# Patient Record
Sex: Female | Born: 1980 | Race: White | Hispanic: No | Marital: Married | Smoking: Never smoker
Health system: Southern US, Community
[De-identification: ages and names within clinical notes are randomized; demographics above are authoritative.]

## PROBLEM LIST (undated history)

## (undated) DIAGNOSIS — U071 COVID-19: Secondary | ICD-10-CM

## (undated) DIAGNOSIS — G43909 Migraine, unspecified, not intractable, without status migrainosus: Secondary | ICD-10-CM

## (undated) DIAGNOSIS — F419 Anxiety disorder, unspecified: Secondary | ICD-10-CM

## (undated) HISTORY — PX: BREAST SURGERY: SHX581

## (undated) HISTORY — DX: Anxiety disorder, unspecified: F41.9

## (undated) HISTORY — PX: BREAST EXCISIONAL BIOPSY: SUR124

---

## 2013-04-29 ENCOUNTER — Ambulatory Visit: Payer: Self-pay | Admitting: Obstetrics and Gynecology

## 2013-04-29 LAB — HEMOGLOBIN: HGB: 14.6 g/dL (ref 12.0–16.0)

## 2013-04-30 LAB — PATHOLOGY REPORT

## 2013-06-25 HISTORY — PX: DILATION AND CURETTAGE OF UTERUS: SHX78

## 2013-11-26 ENCOUNTER — Ambulatory Visit: Payer: Self-pay | Admitting: Emergency Medicine

## 2013-11-26 LAB — RAPID STREP-A WITH REFLX: Micro Text Report: NEGATIVE

## 2013-11-29 LAB — BETA STREP CULTURE(ARMC)

## 2014-05-30 ENCOUNTER — Ambulatory Visit: Payer: Self-pay | Admitting: Emergency Medicine

## 2014-06-04 ENCOUNTER — Inpatient Hospital Stay: Payer: Self-pay | Admitting: Obstetrics and Gynecology

## 2014-06-04 LAB — COMPREHENSIVE METABOLIC PANEL
ALK PHOS: 77 U/L
AST: 18 U/L (ref 15–37)
Albumin: 2.9 g/dL — ABNORMAL LOW (ref 3.4–5.0)
Anion Gap: 11 (ref 7–16)
BILIRUBIN TOTAL: 0.5 mg/dL (ref 0.2–1.0)
BUN: 7 mg/dL (ref 7–18)
CALCIUM: 8.2 mg/dL — AB (ref 8.5–10.1)
CHLORIDE: 109 mmol/L — AB (ref 98–107)
CO2: 21 mmol/L (ref 21–32)
CREATININE: 0.47 mg/dL — AB (ref 0.60–1.30)
EGFR (African American): >60
EGFR (Non-African Amer.): >60
GLUCOSE: 95 mg/dL (ref 65–99)
OSMOLALITY: 279 (ref 275–301)
Potassium: 3.6 mmol/L (ref 3.5–5.1)
SGPT (ALT): 17 U/L
Sodium: 141 mmol/L (ref 136–145)
Total Protein: 6.8 g/dL (ref 6.4–8.2)

## 2014-06-04 LAB — CBC WITH DIFFERENTIAL/PLATELET
Basophil #: 0 10*3/uL (ref 0.0–0.1)
Basophil %: 0.1 %
Eosinophil #: 0 10*3/uL (ref 0.0–0.7)
Eosinophil %: 0.1 %
HCT: 37.1 % (ref 35.0–47.0)
HGB: 12.6 g/dL (ref 12.0–16.0)
LYMPHS PCT: 3.1 %
Lymphocyte #: 0.6 10*3/uL — ABNORMAL LOW (ref 1.0–3.6)
MCH: 33.1 pg (ref 26.0–34.0)
MCHC: 33.8 g/dL (ref 32.0–36.0)
MCV: 98 fL (ref 80–100)
Monocyte #: 0.5 x10 3/mm (ref 0.2–0.9)
Monocyte %: 2.9 %
Neutrophil #: 16.7 10*3/uL — ABNORMAL HIGH (ref 1.4–6.5)
Neutrophil %: 93.8 %
Platelet: 190 10*3/uL (ref 150–440)
RBC: 3.8 10*6/uL (ref 3.80–5.20)
RDW: 14 % (ref 11.5–14.5)
WBC: 17.8 10*3/uL — ABNORMAL HIGH (ref 3.6–11.0)

## 2014-06-04 LAB — URINALYSIS, COMPLETE
BILIRUBIN, UR: NEGATIVE
BLOOD: NEGATIVE
GLUCOSE, UR: NEGATIVE mg/dL (ref 0–75)
NITRITE: NEGATIVE
PROTEIN: NEGATIVE
Ph: 6 (ref 4.5–8.0)
RBC,UR: 1 /HPF (ref 0–5)
SPECIFIC GRAVITY: 1.012 (ref 1.003–1.030)
Squamous Epithelial: 2

## 2014-06-04 LAB — FETAL FIBRONECTIN
Appearance: NORMAL
Fetal Fibronectin: NEGATIVE

## 2014-06-05 LAB — URINE CULTURE

## 2014-06-06 LAB — BETA STREP CULTURE(ARMC)

## 2014-06-09 LAB — CULTURE, BLOOD (SINGLE)

## 2014-07-12 ENCOUNTER — Emergency Department: Payer: Self-pay | Admitting: Emergency Medicine

## 2014-07-12 LAB — COMPREHENSIVE METABOLIC PANEL
ALK PHOS: 97 U/L
ALT: 24 U/L
Albumin: 2.6 g/dL — ABNORMAL LOW (ref 3.4–5.0)
Anion Gap: 8 (ref 7–16)
BILIRUBIN TOTAL: 0.3 mg/dL (ref 0.2–1.0)
BUN: 9 mg/dL (ref 7–18)
CALCIUM: 8.5 mg/dL (ref 8.5–10.1)
CHLORIDE: 106 mmol/L (ref 98–107)
Co2: 23 mmol/L (ref 21–32)
Creatinine: 0.5 mg/dL — ABNORMAL LOW (ref 0.60–1.30)
EGFR (African American): >60
GLUCOSE: 75 mg/dL (ref 65–99)
Osmolality: 271 (ref 275–301)
POTASSIUM: 4.2 mmol/L (ref 3.5–5.1)
SGOT(AST): 31 U/L (ref 15–37)
Sodium: 137 mmol/L (ref 136–145)
Total Protein: 6.8 g/dL (ref 6.4–8.2)

## 2014-07-12 LAB — CBC
HCT: 35.5 % (ref 35.0–47.0)
HGB: 11.9 g/dL — ABNORMAL LOW (ref 12.0–16.0)
MCH: 32.5 pg (ref 26.0–34.0)
MCHC: 33.4 g/dL (ref 32.0–36.0)
MCV: 98 fL (ref 80–100)
Platelet: 220 10*3/uL (ref 150–440)
RBC: 3.65 10*6/uL — ABNORMAL LOW (ref 3.80–5.20)
RDW: 14 % (ref 11.5–14.5)
WBC: 12.3 10*3/uL — AB (ref 3.6–11.0)

## 2014-07-12 LAB — TROPONIN I: Troponin-I: <0.02 ng/mL

## 2014-08-03 ENCOUNTER — Inpatient Hospital Stay: Payer: Self-pay | Admitting: Obstetrics and Gynecology

## 2014-10-15 NOTE — Op Note (Signed)
PATIENT NAME:  Ashlee Blake, [name] MR#:  [id number] DATE OF BIRTH:  1980/07/20  DATE OF PROCEDURE:  04/29/2013  PREOPERATIVE DIAGNOSIS: Missed abortion.   POSTOPERATIVE DIAGNOSIS: Missed abortion.   PROCEDURE: Suction dilation and curettage.   SURGEON: Jennell Cornerhomas Schermerhorn, M.D.   ANESTHESIA: General endotracheal anesthesia.   INDICATION: This is a 34 year old gravida 2, para 1 patient at 2613 weeks estimated gestational age with a documented missed abortion based on ultrasound on 04/28/2013 seven weeks gestation.   PROCEDURE: After adequate general endotracheal anesthesia, the patient was placed in the dorsal supine position with the legs in the candycane stirrups. Lower abdomen, perineal and vagina were prepped with Betadine. The patient's bladder was catheterized yielding 50 mL dark urine. Weighted speculum was placed in the posterior vaginal vault and the anterior cervix was grasped with a single-tooth tenaculum. Cervix was dilated to a #20 Hanks dilator without difficulty. A #8 flexible curette placed into the endometrial cavity. A large amount of tissue consistent with products of conception removed. Sharp curettage performed with good uterine cry and a repeat suction curettage with a small amount of additional tissue. Good hemostasis was noted. Silver nitrate was applied on the anterior tenacula sites. There were no complications. Estimated blood loss 25 mL. Intraoperative fluids 400 mL. The patient tolerated the procedure well and was taken to the recovery room in good condition.     ____________________________ Suzy Bouchardhomas J. Schermerhorn, MD tjs:dp D: 04/29/2013 10:39:24 ET T: 04/29/2013 11:03:41 ET JOB#: [id number]  cc: Suzy Bouchardhomas J. Schermerhorn, MD, <Dictator> Suzy BouchardHOMAS J SCHERMERHORN MD ELECTRONICALLY SIGNED 05/05/2013 10:03

## 2014-11-02 NOTE — H&P (Signed)
L&D Evaluation:  History:  HPI 33yo G3P1011 at 32+2wks by LMP and 9wk ultrasound presenting w/ c/o of diarrhea, nausea/vomiting, chills, back pain and painful contractions. Sx started 12 hrs ago acutely. No fever at home. +flu vaccine 10/14. 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   Dx this week with left axilla abscess vs hidridinidis supp vs folliculitis and is currently on topical clindamycin x 2 days.  VS: HR 87 (max 101) BP 110/61 T 99.1 O2 sats 96-98% on room air  Fetal monitoring is reassuring, Cat I strip 140s with mod var, +15x15 accels, no decels   Presents with abdominal pain, back pain, contractions, nausea/vomiting   Patient's Medical History No Chronic Illness   Patient's Surgical History none  NSVD x1   Medications Pre Natal Vitamins  Clindamycin - topical   Allergies NKDA   Social History none   Family History Non-Contributory   ROS:  ROS See HPI   Exam:  Vital Signs stable   General Ill appearing   Mental Status clear   Chest clear  Lungs CTAB x6 lung fields   Heart normal sinus rhythm, no murmur/gallop/rubs   Abdomen gravid, non-tender, No fundal or cervical motion tenderness   Estimated Fetal Weight Average for gestational age   Fetal Position cephalic by leopolds   Back ? R CVA tenderness, no L CVA   Edema no edema   Reflexes 1+   Pelvic cervix closed and thick   Mebranes Intact   FHT normal rate with no decels, period of tachy to 170 on intial assessment, now wnl   Fetal Heart Rate 140   Ucx regular, q5 min   Ucx Frequency 5 min   Length of each Contraction 2 seconds   Ucx Pain Scale 9   Skin dry, no rashes, 1cm red nodule in L axilla, not fluctuant, no surrounding erythema, no drainage   Lymph no lymphadenopathy   Impression:  Impression dehydration, Illness with preterm contractions   Plan:  Plan UA   Comments - Preterm contractions: Fetal fibronection  pending. Continuous fetal monitoring and external toco. BMZ x2. If contractions do not resolve with sx treatment, will consider serial cervical exams and mag tocolysis/neuroprotection. Currently cervix is closed and posterior. - Diarrhea: Supportive care. 500ml bolus LR, 15450ml/hr. UA  pending. CBC, CMP, blood and urine cultures pending. Physical exam non-specific, no abx at this time as I do not have a source. Left axilla looks not-infected and not spreading. DDx includes gastroenteritis, UTI and complicated UTI, viral syndrome, liver disorders, early sepsis. No peritoneal signs. VSS currently - afebrile, normotensive, O2sats normal. Will continue hydration, gastric acid reduction, pain control while awaiting labs. Consider renal u/s if urinary sx.   Electronic Signatures: Cline CoolsBeasley, Nate Common E (MD)  (Signed 11-Dec-15 00:29)  Authored: L&D Evaluation   Last Updated: 11-Dec-15 00:29 by Cline CoolsBeasley, Abenezer Odonell E (MD)

## 2014-11-02 NOTE — H&P (Signed)
L&D Evaluation:  History:  HPI 33yo G3P1011 at 41+0wks for iol/postdates. Good FM, no LOF, no VB, no ctx. 2-3cm, anterior.  GBS neg, RH+, RPR NR   Patient's Medical History No Chronic Illness   Patient's Surgical History none   Medications Pre Natal Vitamins   Allergies NKDA   Social History none   Family History Non-Contributory   ROS:  ROS All systems were reviewed.  HEENT, CNS, GI, GU, Respiratory, CV, Renal and Musculoskeletal systems were found to be normal.   Exam:  Vital Signs stable   General no apparent distress   Mental Status clear   Chest clear   Heart normal sinus rhythm   Abdomen gravid, non-tender   Estimated Fetal Weight Average for gestational age   Fetal Position cephalic   FHT normal rate with no decels   Fetal Heart Rate 140   Ucx irregular   Impression:  Impression postdates   Plan:  Plan induction of labor   Comments Pitocin induction   Electronic Signatures: Cline CoolsBeasley, Jamey Demchak E (MD)  (Signed 09-Feb-16 08:07)  Authored: L&D Evaluation   Last Updated: 09-Feb-16 08:07 by Cline CoolsBeasley, Banyan Goodchild E (MD)

## 2019-03-31 ENCOUNTER — Emergency Department
Admission: EM | Admit: 2019-03-31 | Discharge: 2019-03-31 | Disposition: A | Discharge status: Home / Self Care | Payer: BC Managed Care – PPO | Attending: Student in an Organized Health Care Education/Training Program | Admitting: Student in an Organized Health Care Education/Training Program

## 2019-03-31 ENCOUNTER — Other Ambulatory Visit: Payer: Self-pay

## 2019-03-31 ENCOUNTER — Emergency Department: Payer: BC Managed Care – PPO

## 2019-03-31 ENCOUNTER — Encounter: Payer: Self-pay | Admitting: Emergency Medicine

## 2019-03-31 DIAGNOSIS — R519 Headache, unspecified: Secondary | ICD-10-CM | POA: Diagnosis present

## 2019-03-31 DIAGNOSIS — R531 Weakness: Secondary | ICD-10-CM | POA: Diagnosis not present

## 2019-03-31 HISTORY — DX: Migraine, unspecified, not intractable, without status migrainosus: G43.909

## 2019-03-31 LAB — BASIC METABOLIC PANEL
Anion gap: 11 (ref 5–15)
BUN: 11 mg/dL (ref 6–20)
CO2: 25 mmol/L (ref 22–32)
Calcium: 9.4 mg/dL (ref 8.9–10.3)
Chloride: 104 mmol/L (ref 98–111)
Creatinine, Ser: 0.66 mg/dL (ref 0.44–1.00)
GFR calc Af Amer: >60 mL/min (ref >60)
GFR calc non Af Amer: >60 mL/min (ref >60)
Glucose, Bld: 118 mg/dL — ABNORMAL HIGH (ref 70–99)
Potassium: 3.8 mmol/L (ref 3.5–5.1)
Sodium: 140 mmol/L (ref 135–145)

## 2019-03-31 LAB — CBC
HCT: 40.4 % (ref 36.0–46.0)
Hemoglobin: 13.9 g/dL (ref 12.0–15.0)
MCH: 31.6 pg (ref 26.0–34.0)
MCHC: 34.4 g/dL (ref 30.0–36.0)
MCV: 91.8 fL (ref 80.0–100.0)
Platelets: 212 10*3/uL (ref 150–400)
RBC: 4.4 MIL/uL (ref 3.87–5.11)
RDW: 12.9 % (ref 11.5–15.5)
WBC: 4.2 10*3/uL (ref 4.0–10.5)
nRBC: 0 % (ref 0.0–0.2)

## 2019-03-31 LAB — POCT PREGNANCY, URINE: Preg Test, Ur: NEGATIVE

## 2019-03-31 MED ORDER — PROCHLORPERAZINE EDISYLATE 10 MG/2ML IJ SOLN
10.0000 mg | Freq: Once | INTRAMUSCULAR | Status: AC
  Administered 2019-03-31: 16:00:00 10 mg via INTRAVENOUS
  Filled 2019-03-31: qty 2

## 2019-03-31 MED ORDER — ACETAMINOPHEN 500 MG PO TABS
1000.0000 mg | ORAL_TABLET | Freq: Once | ORAL | Status: AC
  Administered 2019-03-31: 1000 mg via ORAL
  Filled 2019-03-31: qty 2

## 2019-03-31 MED ORDER — IOHEXOL 350 MG/ML SOLN
75.0000 mL | Freq: Once | INTRAVENOUS | Status: AC | PRN
  Administered 2019-03-31: 16:00:00 75 mL via INTRAVENOUS
  Filled 2019-03-31: qty 75

## 2019-03-31 MED ORDER — DIPHENHYDRAMINE HCL 50 MG/ML IJ SOLN
12.5000 mg | Freq: Once | INTRAMUSCULAR | Status: AC
  Administered 2019-03-31: 12.5 mg via INTRAVENOUS
  Filled 2019-03-31: qty 1

## 2019-03-31 NOTE — ED Triage Notes (Signed)
Patient presents to ED via POV from home with c/o right posterior headache. History of migraines but this does not feel like her normal migraine. Report nausea. Denies diarrhea.

## 2019-03-31 NOTE — ED Notes (Signed)
Patient transported to CT 

## 2019-03-31 NOTE — Discharge Instructions (Addendum)

## 2019-03-31 NOTE — ED Notes (Signed)
Patient c/o sharp pains in head with nausea. HX migraines but does not feel like normal migraine

## 2019-03-31 NOTE — ED Provider Notes (Signed)
[facility] Emergency Department Provider Note    First MD Initiated Contact with Patient 03/31/19 1522     (approximate)  I have reviewed the triage vital signs and the nursing notes.   HISTORY  Chief Complaint Headache    HPI Ashlee Blake is a 38 y.o. female history of migraine headaches presents the ER with sudden onset left posterior headache that felt like someone was stabbing her in the back of the head associated with generalized weakness blurred vision.  No fevers.  States that she took Motrin with some improvement in her symptoms but feels that this is the worst type of headache she is ever had.  Did described as sudden onset.  Currently rates headache is mild.  Denies any numbness or tingling.    Past Medical History:  Diagnosis Date  . Migraine    No family history on file.  There are no active problems to display for this patient.     Prior to Admission medications   Not on File    Allergies Patient has no known allergies.    Social History Social History   Tobacco Use  . Smoking status: Never Smoker  . Smokeless tobacco: Never Used  Substance Use Topics  . Alcohol use: Yes    Comment: Social   . Drug use: Never    Review of Systems Patient denies headaches, rhinorrhea, blurry vision, numbness, shortness of breath, chest pain, edema, cough, abdominal pain, nausea, vomiting, diarrhea, dysuria, fevers, rashes or hallucinations unless otherwise stated above in HPI. ____________________________________________   PHYSICAL EXAM:  VITAL SIGNS: Vitals:   03/31/19 1339  BP: 116/65  Pulse: 63  Resp: 18  Temp: 98.6 F (37 C)  SpO2: 100%    Constitutional: Alert and oriented.  Eyes: Conjunctivae are normal.  Head: Atraumatic. Nose: No congestion/rhinnorhea. Mouth/Throat: Mucous membranes are moist.   Neck: No stridor. Painless ROM.  Cardiovascular: Normal rate, regular rhythm. Grossly normal heart sounds.  Good  peripheral circulation. Respiratory: Normal respiratory effort.  No retractions. Lungs CTAB. Gastrointestinal: Soft and nontender. No distention. No abdominal bruits. No CVA tenderness. Genitourinary:  Musculoskeletal: No lower extremity tenderness nor edema.  No joint effusions. Neurologic:  CN- intact.  No facial droop, Normal FNF.  Normal heel to shin.  Sensation intact bilaterally. Normal speech and language. No gross focal neurologic deficits are appreciated. No gait instability. Skin:  Skin is warm, dry and intact. No rash noted. Psychiatric: Mood and affect are normal. Speech and behavior are normal.  ____________________________________________   LABS (all labs ordered are listed, but only abnormal results are displayed)  Results for orders placed or performed during the hospital encounter of 03/31/19 (from the past 24 hour(s))  CBC     Status: None   Collection Time: 03/31/19  1:41 PM  Result Value Ref Range   WBC 4.2 4.0 - 10.5 K/uL   RBC 4.40 3.87 - 5.11 MIL/uL   Hemoglobin 13.9 12.0 - 15.0 g/dL   HCT 49.4 49.6 - 75.9 %   MCV 91.8 80.0 - 100.0 fL   MCH 31.6 26.0 - 34.0 pg   MCHC 34.4 30.0 - 36.0 g/dL   RDW 16.3 84.6 - 65.9 %   Platelets 212 150 - 400 K/uL   nRBC 0.0 0.0 - 0.2 %  Basic metabolic panel     Status: Abnormal   Collection Time: 03/31/19  1:41 PM  Result Value Ref Range   Sodium 140 135 - 145 mmol/L  Potassium 3.8 3.5 - 5.1 mmol/L   Chloride 104 98 - 111 mmol/L   CO2 25 22 - 32 mmol/L   Glucose, Bld 118 (H) 70 - 99 mg/dL   BUN 11 6 - 20 mg/dL   Creatinine, Ser 0.66 0.44 - 1.00 mg/dL   Calcium 9.4 8.9 - 10.3 mg/dL   GFR calc non Af Amer >60 >60 mL/min   GFR calc Af Amer >60 >60 mL/min   Anion gap 11 5 - 15  Pregnancy, urine POC     Status: None   Collection Time: 03/31/19  4:15 PM  Result Value Ref Range   Preg Test, Ur NEGATIVE NEGATIVE   ____________________________________________ ____________________________________________  RADIOLOGY  I  personally reviewed all radiographic images ordered to evaluate for the above acute complaints and reviewed radiology reports and findings.  These findings were personally discussed with the patient.  Please see medical record for radiology report.  ____________________________________________   PROCEDURES  Procedure(s) performed:  Procedures    Critical Care performed: no ____________________________________________   INITIAL IMPRESSION / ASSESSMENT AND PLAN / ED COURSE  Pertinent labs & imaging results that were available during my care of the patient were reviewed by me and considered in my medical decision making (see chart for details).   DDX: migraine, tension, cluster, sah, iph, mass  Ashlee Blake is a 38 y.o. who presents to the ED with headache as described above.  Neuro exam is intact.  Given the sudden onset nature CT imaging was ordered to evaluate for aneurysm or subarachnoid.  CT imaging is unremarkable.  Patient was given migraine cocktail with improvement in symptoms.  Likely variant of her known migraine headaches.  No evidence of infectious process.  Do believe she stable and appropriate for outpatient follow-up.     The patient was evaluated in Emergency Department today for the symptoms described in the history of present illness. He/she was evaluated in the context of the global COVID-19 pandemic, which necessitated consideration that the patient might be at risk for infection with the SARS-CoV-2 virus that causes COVID-19. Institutional protocols and algorithms that pertain to the evaluation of patients at risk for COVID-19 are in a state of rapid change based on information released by regulatory bodies including the CDC and federal and state organizations. These policies and algorithms were followed during the patient's care in the ED.  As part of my medical decision making, I reviewed the following data within the [place] notes reviewed  and incorporated, Labs reviewed, notes from prior ED visits and [place] Controlled Substance Database   ____________________________________________   FINAL CLINICAL IMPRESSION(S) / ED DIAGNOSES  Final diagnoses:  Bad headache      NEW MEDICATIONS STARTED DURING THIS VISIT:  New Prescriptions   No medications on file     Note:  This document was prepared using Dragon voice recognition software and may include unintentional dictation errors.    Merlyn Lot, MD 03/31/19 1721

## 2019-04-16 NOTE — Progress Notes (Signed)
Virtual Visit via Video Note The purpose of this virtual visit is to provide medical care while limiting exposure to the novel coronavirus.    Consent was obtained for video visit:  Yes.   Answered questions that patient had about telehealth interaction:  Yes.   I discussed the limitations, risks, security and privacy concerns of performing an evaluation and management service by telemedicine. I also discussed with the patient that there may be a patient responsible charge related to this service. The patient expressed understanding and agreed to proceed.  Pt location: Home Physician Location: office Name of referring provider:  [place] Pink, MD I connected with Ashlee Blake at patients initiation/request on 04/20/2019 at  2:50 PM EDT by video enabled telemedicine application and verified that I am speaking with the correct person using two identifiers. Pt MRN:  [id number] Pt DOB:  1980/10/26 Video Participants:  Ashlee Blake   History of Present Illness:  Ashlee Blake is a 38 year old female who presents for headaches.  History supplemented by ED note.  CT/CTA of head personally reviewed.  She presented to the ED at [facility] on 03/31/2019 with sudden onset severe stabbing left occipital headache.  There was associated dizziness, blurred vision, nausea, generalized weakness.  She reported difficulty moving both sides of her face.  No vomiting, photophobia, phonophobia, language deficit or numbness and tingling.  Motrin provided mild relief.  It was the worst headache of her life.  She was afebrile.  CT/CTA of head negative for bleed or aneurysm.  She was treated with oral Tylenol and injection of Benadryl, Compazine and iohexol.  It lasted about 3 hours.  She has history of migraines since her early 9s, described as a moderate pressure bi-occipital headache with photophobia and phonophobia but no nausea, vomiting, or visual disturbance.  They respond to ibuprofen and rest in bed and resolve after  a couple of hours.  They occur once a year.  When she was diagnosed with migraines, she had a migraine aura without headache, described as dizziness and asymmetric face.  MRI of brain at that time was reportedly unremarkable.     Caffeine:  Iced tea or coffee daily Diet:  hydrates Exercise:  yes Depression:  no; Anxiety:  Yes.   Other pain:  no Sleep hygiene:  varies Family history:  Sister (migraine), mother (stroke)   Past Medical History: Past Medical History:  Diagnosis Date  . Migraine     Medications: No outpatient encounter medications on file as of 04/20/2019.   No facility-administered encounter medications on file as of 04/20/2019.     Allergies: No Known Allergies  Family History: No family history on file.  Social History: Social History   Socioeconomic History  . Marital status: Married    Spouse name: Not on file  . Number of children: Not on file  . Years of education: Not on file  . Highest education level: Not on file  Occupational History  . Not on file  Social Needs  . Financial resource strain: Not on file  . Food insecurity    Worry: Not on file    Inability: Not on file  . Transportation needs    Medical: Not on file    Non-medical: Not on file  Tobacco Use  . Smoking status: Never Smoker  . Smokeless tobacco: Never Used  Substance and Sexual Activity  . Alcohol use: Yes    Comment: Social   . Drug use: Never  . Sexual activity:  Not on file  Lifestyle  . Physical activity    Days per week: Not on file    Minutes per session: Not on file  . Stress: Not on file  Relationships  . Social Musician on phone: Not on file    Gets together: Not on file    Attends religious service: Not on file    Active member of club or organization: Not on file    Attends meetings of clubs or organizations: Not on file    Relationship status: Not on file  . Intimate partner violence    Fear of current or ex partner: Not on file     Emotionally abused: Not on file    Physically abused: Not on file    Forced sexual activity: Not on file  Other Topics Concern  . Not on file  Social History Narrative  . Not on file    Observations/Objective:   Height 5\' 5"  (1.651 m), weight 128 lb (58.1 kg), last menstrual period 03/31/2019. No acute distress.  Alert and oriented.  Speech fluent and not dysarthric.  Language intact.  Eyes orthophoric on primary gaze.  Face symmetric.  Assessment and Plan:   1.  Migraine with aura, without status migrainosus, not intractable.  Hopefully, an isolated event 2.  Migraine without aura, without status migrainosus, not intractable.  Stable  Given that this is an isolated event and her typical migraines are infrequent, I would not initiate a preventative medication.  She wishes to continue using OTC analgesics.  If she should have a recurrent migraine that does not respond to OTC analgesics, she is willing to consider triptan.  Follow Up Instructions:    -I discussed the assessment and treatment plan with the patient. The patient was provided an opportunity to ask questions and all were answered. The patient agreed with the plan and demonstrated an understanding of the instructions.   The patient was advised to call back or seek an in-person evaluation if the symptoms worsen or if the condition fails to improve as anticipated.  05/31/2019, DO

## 2019-04-20 ENCOUNTER — Other Ambulatory Visit: Payer: Self-pay

## 2019-04-20 ENCOUNTER — Telehealth (INDEPENDENT_AMBULATORY_CARE_PROVIDER_SITE_OTHER): Payer: BC Managed Care – PPO | Admitting: Neurology

## 2019-04-20 ENCOUNTER — Encounter: Payer: Self-pay | Admitting: Neurology

## 2019-04-20 VITALS — Ht 65.0 in | Wt 128.0 lb

## 2019-04-20 DIAGNOSIS — G43009 Migraine without aura, not intractable, without status migrainosus: Secondary | ICD-10-CM

## 2019-04-20 DIAGNOSIS — G43109 Migraine with aura, not intractable, without status migrainosus: Secondary | ICD-10-CM

## 2020-08-17 ENCOUNTER — Other Ambulatory Visit: Payer: Self-pay | Admitting: Obstetrics and Gynecology

## 2020-08-17 DIAGNOSIS — Z1231 Encounter for screening mammogram for malignant neoplasm of breast: Secondary | ICD-10-CM

## 2020-08-17 LAB — RESULTS CONSOLE HPV: CHL HPV: NEGATIVE

## 2020-08-17 LAB — HM PAP SMEAR: HM Pap smear: NEGATIVE

## 2020-08-18 ENCOUNTER — Other Ambulatory Visit: Payer: Self-pay | Admitting: Obstetrics and Gynecology

## 2020-08-18 DIAGNOSIS — N6310 Unspecified lump in the right breast, unspecified quadrant: Secondary | ICD-10-CM

## 2020-08-30 ENCOUNTER — Other Ambulatory Visit: Payer: Self-pay

## 2020-08-30 ENCOUNTER — Inpatient Hospital Stay: Admission: RE | Admit: 2020-08-30 | Payer: BC Managed Care – PPO | Source: Ambulatory Visit

## 2020-08-30 ENCOUNTER — Ambulatory Visit
Admission: RE | Admit: 2020-08-30 | Discharge: 2020-08-30 | Disposition: A | Discharge status: Home / Self Care | Payer: BC Managed Care – PPO | Source: Ambulatory Visit | Attending: Obstetrics and Gynecology | Admitting: Obstetrics and Gynecology

## 2020-08-30 DIAGNOSIS — N6310 Unspecified lump in the right breast, unspecified quadrant: Secondary | ICD-10-CM

## 2020-09-04 ENCOUNTER — Ambulatory Visit
Admission: EM | Admit: 2020-09-04 | Discharge: 2020-09-04 | Disposition: A | Discharge status: Home / Self Care | Payer: BC Managed Care – PPO | Attending: Emergency Medicine | Admitting: Emergency Medicine

## 2020-09-04 ENCOUNTER — Other Ambulatory Visit: Payer: Self-pay

## 2020-09-04 DIAGNOSIS — M79605 Pain in left leg: Secondary | ICD-10-CM

## 2020-09-04 NOTE — ED Provider Notes (Signed)
[facility] [place] [facility] [place]    Name: Ashlee Blake DOB: Apr 01, 1981 MRN: [id number] CSN: [id number] PCP: Jerl Mina, MD  Arrival date and time:  09/04/20 1115  Chief Complaint:  Leg Pain (left)   NOTE: Prior to seeing the patient today, I have reviewed the triage nursing documentation and vital signs. Clinical staff has updated patient's PMH/PSHx, current medication list, and drug allergies/intolerances to ensure comprehensive history available to assist in medical decision making.   History:   HPI: Ashlee Blake is a 40 y.o. female who presents today with complaints of left leg pain.  Patient states his left leg pain started approximately 1/2 weeks ago after her dermatological surgery to remove atypical cells in the back of her left knee.  The pain has been continuous and has not been able to be resolved with ice.  The pain starts at the location of her incision and radiates down the left side of her calf muscles to her anterior ankle.  She is also noted some swelling to the outside of her left ankle which made it difficult for her to put her tennis shoes on.  She has been following the instructions were given by her dermatologist and has been abstaining from strenuous activity.  She denies any fevers, body aches, fevers, or general malaise.  Past Medical History:  Diagnosis Date  . Migraine     Past Surgical History:  Procedure Laterality Date  . BREAST EXCISIONAL BIOPSY    . DILATION AND CURETTAGE OF UTERUS  2015    Family History  Problem Relation Age of Onset  . Stroke Mother   . Hypertension Mother   . High Cholesterol Mother   . Diabetes Mother        boarder line  . Healthy Father   . Healthy Sister   . Healthy Son   . Breast cancer Other     Social History   Tobacco Use  . Smoking status: Never Smoker  . Smokeless tobacco: Never Used  Vaping Use  . Vaping Use: Never used  Substance Use Topics  . Alcohol use: Yes    Comment: Social   . Drug use:  Never    There are no problems to display for this patient.   Home Medications:    Current Meds  Medication Sig  . Ascorbic Acid (VITAMIN C PO) Take 1,400 mg by mouth. Take 2 a day  . cephALEXin (KEFLEX) 500 MG capsule Take 500 mg by mouth 2 (two) times daily.  . Cholecalciferol (VITAMIN D3 PO) Take 1,000 Units by mouth.  . fluticasone (FLONASE) 50 MCG/ACT nasal spray Place into the nose.  . Loratadine 10 MG CAPS Take by mouth.  . magnesium (MAGTAB) 84 MG ( ) TBCR SR tablet Take 84 mg by mouth.  . valACYclovir (VALTREX) 1000 MG tablet TAKE 1 TABLET (1,000 MG TOTAL) BY MOUTH 2 (TWO) TIMES DAILY AS NEEDED    Allergies:   Patient has no known allergies.  Review of Systems (ROS): Review of Systems  Constitutional: Negative for fatigue and fever.  Musculoskeletal: Positive for joint swelling and myalgias. Negative for gait problem.  All other systems reviewed and are negative.    Vital Signs: Today's Vitals   09/04/20 1126 09/04/20 1128  BP:  124/82  Pulse:  73  Resp:  18  Temp:  98.6 F (37 C)  TempSrc:  Oral  SpO2:  100%  Weight: 132 lb (59.9 kg)   Height: 5\' 5"  (1.651 m)  PainSc: 4      Physical Exam: Physical Exam Vitals and nursing note reviewed.  Constitutional:      Appearance: Normal appearance.  HENT:     Right Ear: Tympanic membrane normal.     Left Ear: Tympanic membrane normal.  Cardiovascular:     Rate and Rhythm: Normal rate and regular rhythm.     Pulses: Normal pulses.          Dorsalis pedis pulses are 2+ on the right side and 2+ on the left side.       Posterior tibial pulses are 2+ on the right side and 2+ on the left side.     Heart sounds: Normal heart sounds.  Pulmonary:     Effort: Pulmonary effort is normal.     Breath sounds: Normal breath sounds.  Musculoskeletal:     Left foot: Normal range of motion. Swelling present.     Comments: Mild nonpitting edema noted to the external aspect of left knee.  Feet:     Left foot:      Skin integrity: No erythema or warmth.  Skin:    General: Skin is warm and dry.       Neurological:     General: No focal deficit present.     Mental Status: She is alert and oriented to person, place, and time.  Psychiatric:        Mood and Affect: Mood normal.        Behavior: Behavior normal.     Urgent Care Treatments / Results:   LABS: PLEASE NOTE: all labs that were ordered this encounter are listed, however only abnormal results are displayed. Labs Reviewed - No data to display  EKG: -None  RADIOLOGY: No results found.  PROCEDURES: Procedures  MEDICATIONS RECEIVED THIS VISIT: Medications - No data to display  PERTINENT CLINICAL COURSE NOTES/UPDATES:   Initial Impression / Assessment and Plan / Urgent Care Course:  Pertinent labs & imaging results that were available during my care of the patient were personally reviewed by me and considered in my medical decision making (see lab/imaging section of note for values and interpretations).  Ashlee Blake is a 40 y.o. female who presents to [facility] today with complaints of left leg pain, diagnosed with the same, and treated as such with the directions below. NP and patient reviewed discharge instructions below during visit.   Patient is well appearing overall in clinic today. She does not appear to be in any acute distress. Presenting symptoms (see HPI) and exam as documented above.   I have reviewed the follow up and strict return precautions for any new or worsening symptoms. Patient is aware of symptoms that would be deemed urgent/emergent, and would thus require further evaluation either here or in the emergency department. At the time of discharge, she verbalized understanding and consent with the discharge plan as it was reviewed with her. All questions were fielded by provider and/or clinic staff prior to patient discharge.    Final Clinical Impressions / Urgent Care Diagnoses:   Final diagnoses:  Left  leg pain    New Prescriptions:  [place] Controlled Substance Registry consulted? Not Applicable  No orders of the defined types were placed in this encounter.     Discharge Instructions     You were seen for left leg pain and are being treated for the same..   Make sure you follow-up with your dermatologist as scheduled on Tuesday.  Use over-the-counter NSAIDs such  as naproxen and ibuprofen as needed for the pain.  Elevate your left leg when sitting in bed.  Compression as needed.  Take care, Dr. Sharlet Salina, NP-c     Recommended Follow up Care:  Patient encouraged to follow up with the following provider within the specified time frame, or sooner as dictated by the severity of her symptoms. As always, she was instructed that for any urgent/emergent care needs, she should seek care either here or in the emergency department for more immediate evaluation.   Bailey Mech, DNP, NP-c    Bailey Mech, NP 09/04/20 1153

## 2020-09-04 NOTE — Discharge Instructions (Signed)
You were seen for left leg pain and are being treated for the same..   Make sure you follow-up with your dermatologist as scheduled on Tuesday.  Use over-the-counter NSAIDs such as naproxen and ibuprofen as needed for the pain.  Elevate your left leg when sitting in bed.  Compression as needed.  Take care, Dr. Sharlet Salina, NP-c

## 2020-09-04 NOTE — ED Triage Notes (Addendum)
Pt c/o recent skin surgery to remove some atypical skin cells (08/22/20) behind her left knee. Pt states the area still has stitches, to be removed this coming Tuesday. Pt states the incision area is painful and red. She is taking cephalexin post surgery. Pt reports pain down the back of her left leg, into her ankle, with some swelling to the ankle. Pt denies redness or streaking down the leg, f/n/v/d or other symptoms.

## 2020-09-05 ENCOUNTER — Other Ambulatory Visit: Payer: Self-pay | Admitting: Orthopaedic Surgery

## 2020-09-05 ENCOUNTER — Ambulatory Visit
Admission: RE | Admit: 2020-09-05 | Discharge: 2020-09-05 | Disposition: A | Discharge status: Home / Self Care | Payer: BC Managed Care – PPO | Source: Ambulatory Visit | Attending: Orthopaedic Surgery | Admitting: Orthopaedic Surgery

## 2020-09-05 DIAGNOSIS — R2242 Localized swelling, mass and lump, left lower limb: Secondary | ICD-10-CM

## 2022-04-21 IMAGING — MG DIGITAL DIAGNOSTIC BILAT W/ TOMO W/ CAD
6 of 10 series · 6 of 30 positions shown · non-contrast
Comparison: Previous exam(s).

CLINICAL DATA: Patient presents for palpable abnormality within the
right breast.

EXAM:
DIGITAL DIAGNOSTIC BILATERAL MAMMOGRAM WITH TOMOSYNTHESIS AND CAD;
ULTRASOUND RIGHT BREAST LIMITED
TECHNIQUE: Bilateral digital diagnostic mammography and breast tomosynthesis
was performed. The images were evaluated with computer-aided
detection.; Targeted ultrasound examination of the right breast was
performed

[R TAN synth-2D]
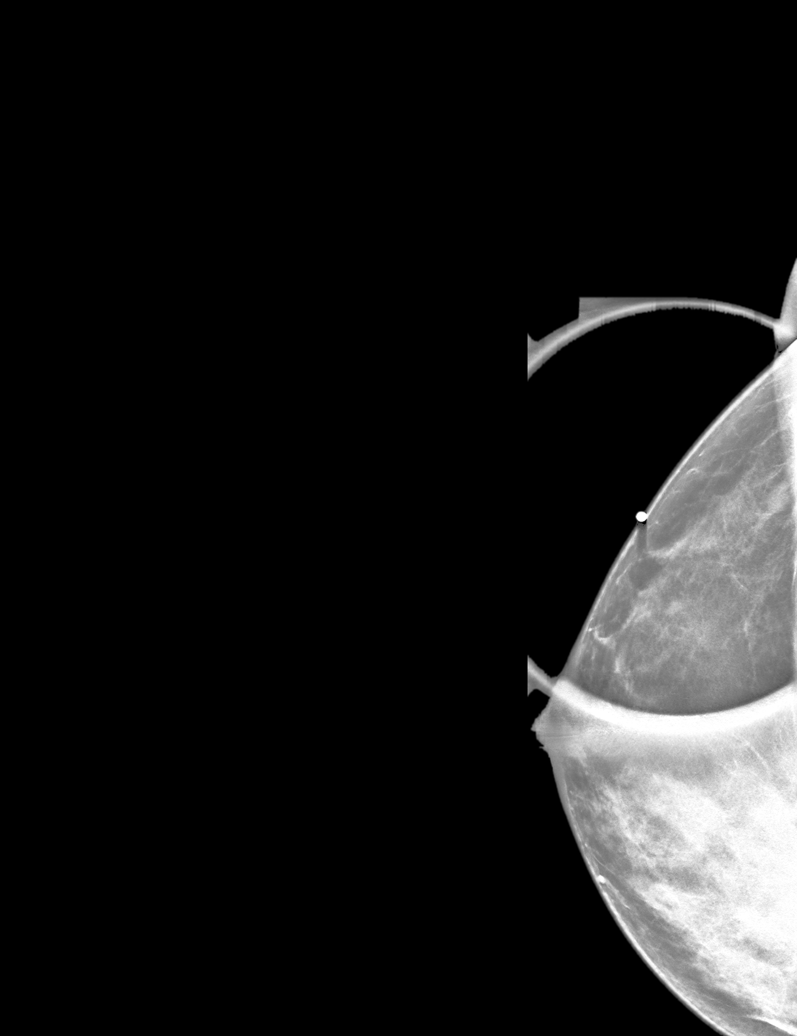

[L MLO synth-2D]
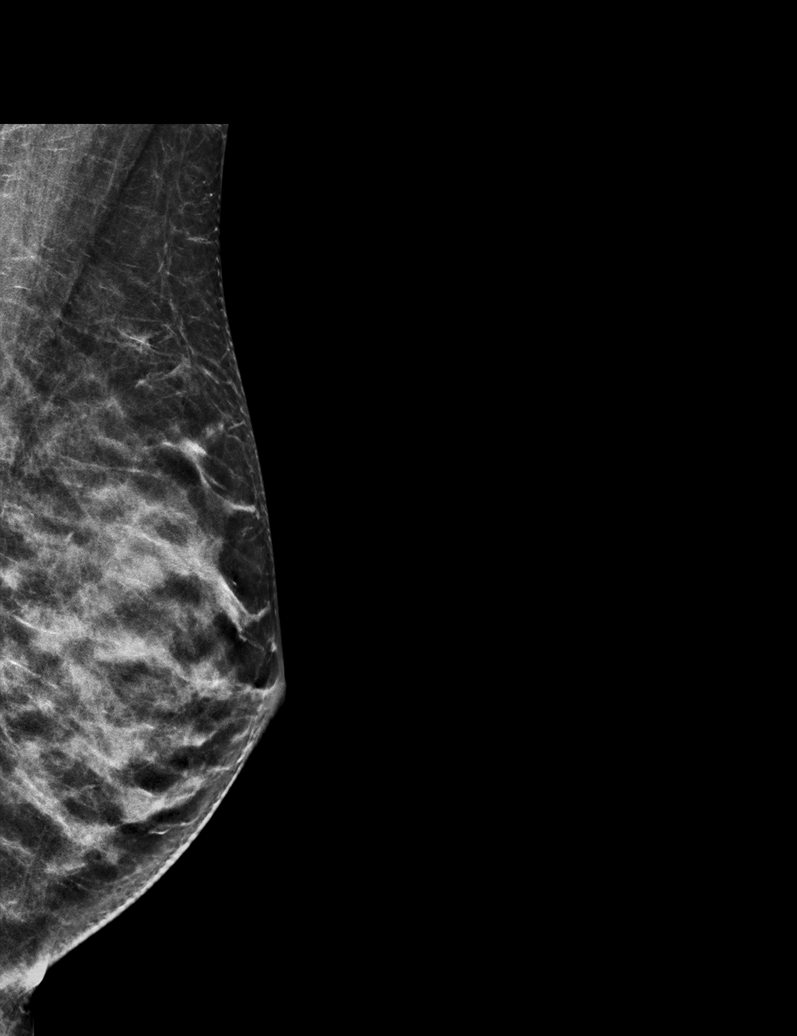

[L CC synth-2D]
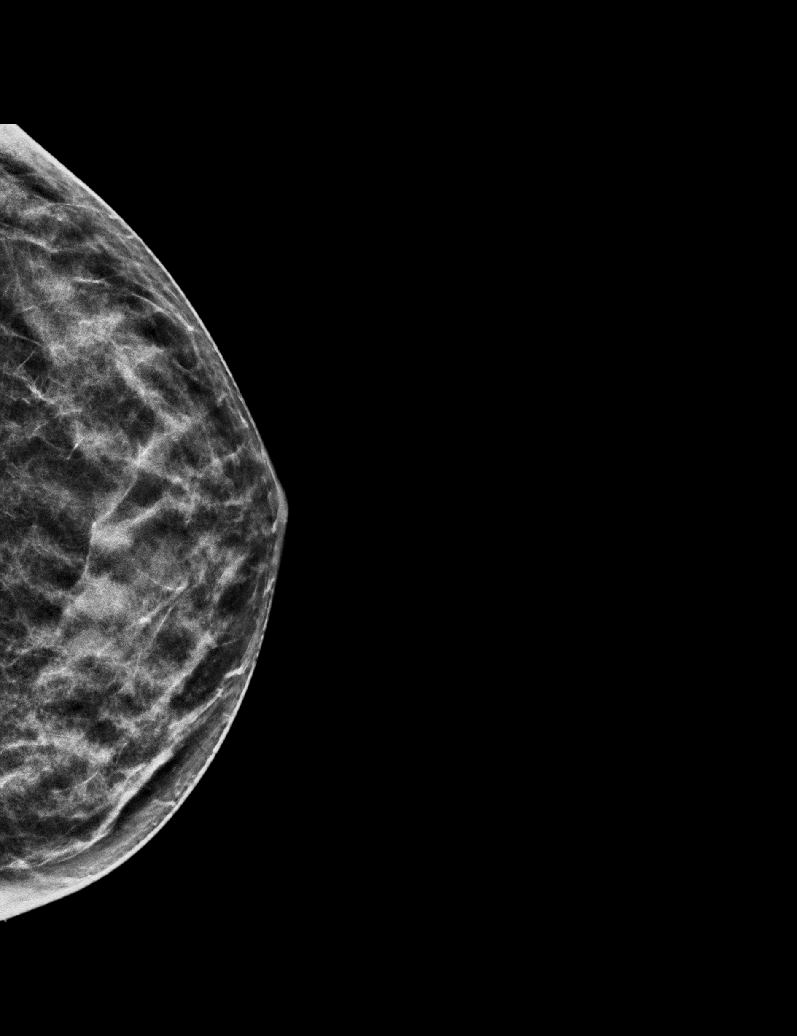

[R MLO synth-2D]
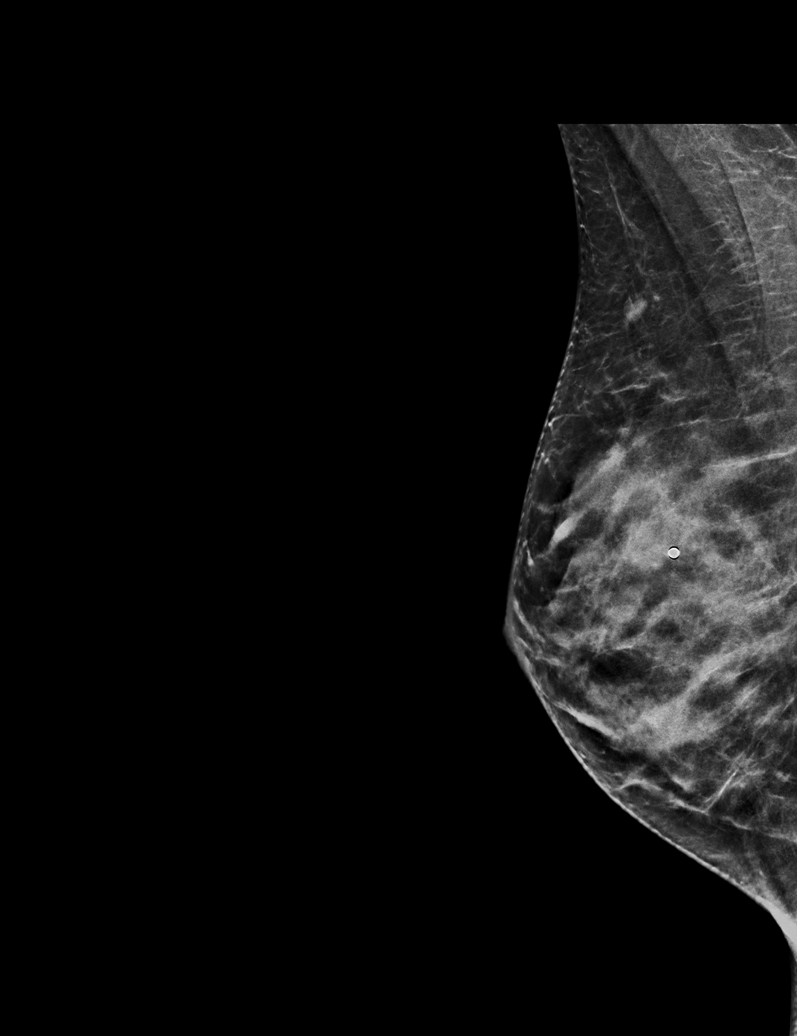

[R CC synth-2D]
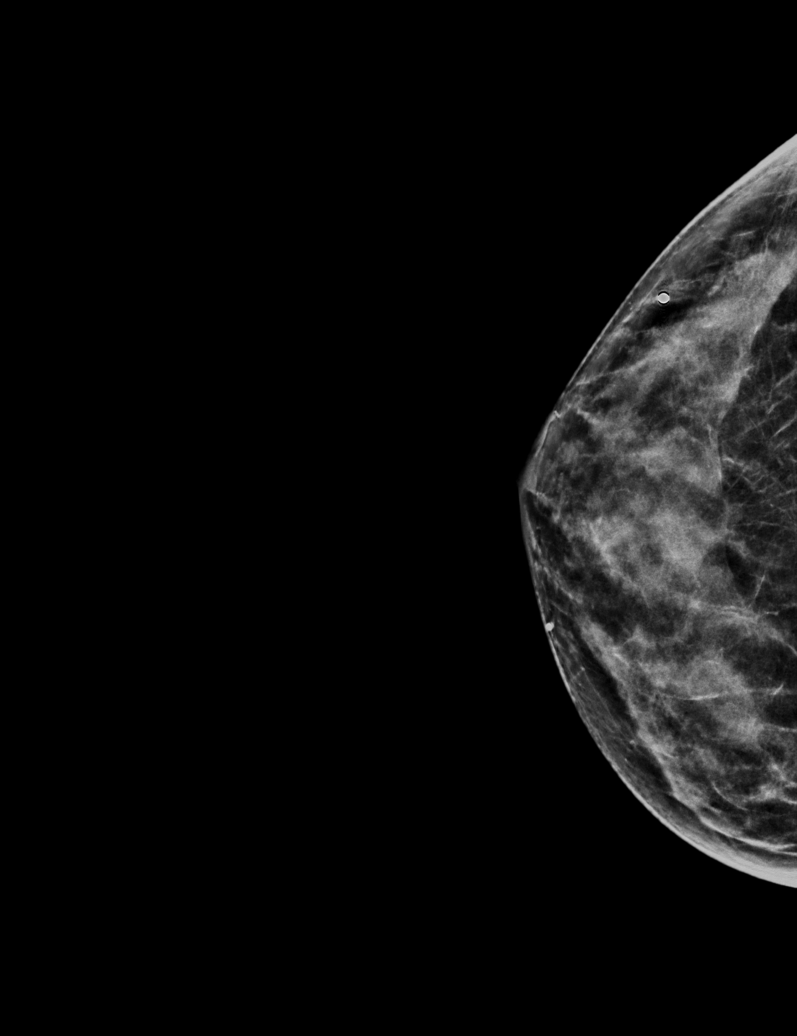

[R MLO tomo · tomo slice 28/55.0]
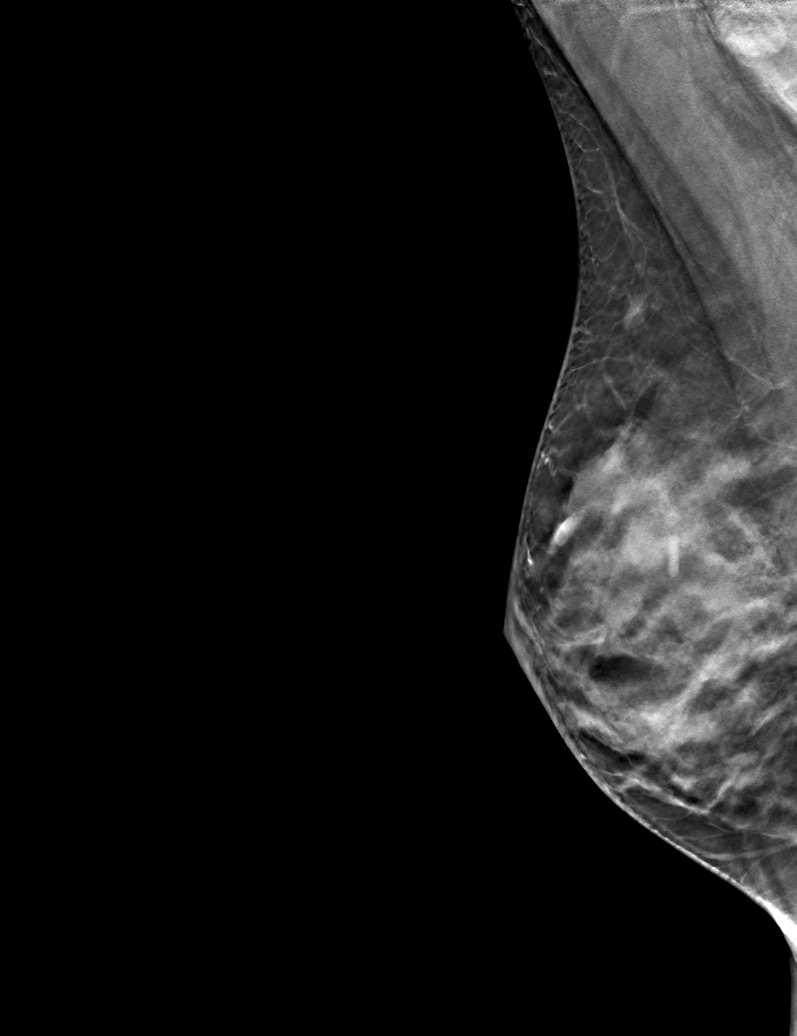

[6 of 30 positions shown; findings below may reference images not displayed]

ACR Breast Density Category c: The breast tissue is heterogeneously
dense, which may obscure small masses.
FINDINGS: No concerning masses, calcifications or nonsurgical distortion
identified within the right or left breast.

On physical exam, dense tissue is palpated within the outer right
breast.

Targeted ultrasound is performed, showing normal dense tissue
without suspicious mass within the outer right breast.
IMPRESSION: No mammographic evidence for malignancy.

RECOMMENDATION:
Continued clinical evaluation for right breast palpable area of
concern.

Screening mammogram at age 42 unless there are persistent or
intervening clinical concerns. (Code:DH-D-[id number])

I have discussed the findings and recommendations with the patient.
If applicable, a reminder letter will be sent to the patient
regarding the next appointment.

BI-RADS CATEGORY  2: Benign.

## 2022-09-27 ENCOUNTER — Ambulatory Visit
Admission: EM | Admit: 2022-09-27 | Discharge: 2022-09-27 | Disposition: A | Discharge status: Home / Self Care | Payer: BC Managed Care – PPO

## 2022-09-27 ENCOUNTER — Other Ambulatory Visit: Payer: Self-pay

## 2022-09-27 DIAGNOSIS — U071 COVID-19: Secondary | ICD-10-CM

## 2022-09-27 DIAGNOSIS — R509 Fever, unspecified: Secondary | ICD-10-CM | POA: Diagnosis not present

## 2022-09-27 DIAGNOSIS — R051 Acute cough: Secondary | ICD-10-CM | POA: Diagnosis not present

## 2022-09-27 NOTE — Discharge Instructions (Addendum)
-  Newest CDC guidelines state you should stay at home and isolate until you are 24 hours without a fever and your symptoms have improved.  This will probably be at least another 2 to 3 days for you. - Take ibuprofen and Tylenol as needed for fever and make sure to rest increase your fluid intake. - Take over-the-counter cough medication as needed for your symptoms. - Return or go to ER if you have any uncontrolled fever, weakness or shortness of breath that is worsening.

## 2022-09-27 NOTE — ED Triage Notes (Addendum)
Pt is here after testing POSITIVE for COVID(home test) with same symptoms of chills, sore throat, bodyaches, cough, nasal congestion, pt has taken OTC meds to relieve discomfort. Pt denies chest pain when coughing and pain overall.

## 2022-09-27 NOTE — ED Provider Notes (Signed)
[facility]    CSN: [id number] Arrival date & time: 09/27/22  1311      History   Chief Complaint Chief Complaint  Patient presents with   Sore Throat   Chills   Nasal Congestion    HPI Ashlee Blake is a 42 y.o. female presenting for 4 to 5-day history of fever, fatigue, body aches, cough, congestion, sore throat and shortness of breath.  Denies abdominal pain, vomiting or diarrhea.  Sore throat has improved.  Fevers have only gone down with use of antipyretics.  She has not taken any antipyretics today.  She denies any sick contacts.  States she did a COVID test 4 days ago and is positive.  She denies any history of any major medical problems.  No history of cardiopulmonary disease, diabetes, autoimmune disease.  Taking OTC meds.  No other complaints.  HPI  Past Medical History:  Diagnosis Date   Migraine     There are no problems to display for this patient.   Past Surgical History:  Procedure Laterality Date   BREAST EXCISIONAL BIOPSY     DILATION AND CURETTAGE OF UTERUS  2015    OB History   No obstetric history on file.      Home Medications    Prior to Admission medications   Medication Sig Start Date End Date Taking? Authorizing Provider  Ascorbic Acid (VITAMIN C PO) Take 1,400 mg by mouth. Take 2 a day    [provider]  cephALEXin (KEFLEX) 500 MG capsule Take 500 mg by mouth 2 (two) times daily. 08/08/20   [provider]  Cholecalciferol (VITAMIN D3 PO) Take 1,000 Units by mouth.    [provider]  fluticasone (FLONASE) 50 MCG/ACT nasal spray Place into the nose. 04/16/15   [provider]  levonorgestrel (LILETTA, 52 MG,) 20.1 MCG/DAY IUD IUD by Intrauterine route.    [provider]  Loratadine 10 MG CAPS Take by mouth.    [provider]  magnesium (MAGTAB) 84 MG (7MEQ) TBCR SR tablet Take 84 mg by mouth.    [provider]  valACYclovir (VALTREX) 1000 MG tablet TAKE 1 TABLET  (1,000 MG TOTAL) BY MOUTH 2 (TWO) TIMES DAILY AS NEEDED 09/27/16   [provider]    Family History Family History  Problem Relation Age of Onset   Stroke Mother    Hypertension Mother    High Cholesterol Mother    Diabetes Mother        boarder line   Healthy Father    Healthy Sister    Healthy Son    Breast cancer Other     Social History Social History   Tobacco Use   Smoking status: Never   Smokeless tobacco: Never  Vaping Use   Vaping Use: Never used  Substance Use Topics   Alcohol use: Yes    Comment: Social    Drug use: Never     Allergies   Patient has no known allergies.   Review of Systems Review of Systems  Constitutional:  Positive for fatigue and fever. Negative for chills and diaphoresis.  HENT:  Positive for congestion, rhinorrhea and sore throat. Negative for ear pain, sinus pressure and sinus pain.   Respiratory:  Positive for cough and shortness of breath. Negative for wheezing.   Cardiovascular:  Negative for chest pain.  Gastrointestinal:  Negative for abdominal pain, nausea and vomiting.  Musculoskeletal:  Negative for arthralgias and myalgias.  Skin:  Negative for  rash.  Neurological:  Negative for weakness and headaches.  Hematological:  Negative for adenopathy.     Physical Exam Triage Vital Signs ED Triage Vitals  Enc Vitals Group     BP --      Pulse --      Resp --      Temp --      Temp Source 09/27/22 1319 Oral     SpO2 --      Weight --      Height --      Head Circumference --      Peak Flow --      Pain Score 09/27/22 1317 0     Pain Loc --      Pain Edu? --      Excl. in [place]? --    No data found.  Updated Vital Signs BP 102/68 (BP Location: Right Arm)   Pulse (!) 109   Temp (!) 102.2 F (39 C) (Oral) Comment: notified the provider  Resp 17   SpO2 98%      Physical Exam Vitals and nursing note reviewed.  Constitutional:      General: She is not in acute distress.    Appearance: Normal  appearance. She is ill-appearing and diaphoretic. She is not toxic-appearing.  HENT:     Head: Normocephalic and atraumatic.     Nose: Congestion present.     Mouth/Throat:     Mouth: Mucous membranes are moist.     Pharynx: Oropharynx is clear.  Eyes:     General: No scleral icterus.       Right eye: No discharge.        Left eye: No discharge.     Conjunctiva/sclera: Conjunctivae normal.  Cardiovascular:     Rate and Rhythm: Regular rhythm. Tachycardia present.     Heart sounds: Normal heart sounds.  Pulmonary:     Effort: Pulmonary effort is normal. No respiratory distress.     Breath sounds: Normal breath sounds. No wheezing, rhonchi or rales.  Musculoskeletal:     Cervical back: Neck supple.  Neurological:     General: No focal deficit present.     Mental Status: She is alert. Mental status is at baseline.     Motor: No weakness.     Gait: Gait normal.  Psychiatric:        Mood and Affect: Mood normal.        Behavior: Behavior normal.        Thought Content: Thought content normal.      UC Treatments / Results  Labs (all labs ordered are listed, but only abnormal results are displayed) Labs Reviewed - No data to display  EKG   Radiology No results found.  Procedures Procedures (including critical care time)  Medications Ordered in UC Medications - No data to display  Initial Impression / Assessment and Plan / UC Course  I have reviewed the triage vital signs and the nursing notes.  Pertinent labs & imaging results that were available during my care of the patient were reviewed by me and considered in my medical decision making (see chart for details).   42 year old female presents for fever, fatigue, body aches, cough, congestion, sore throat and shortness of breath that began 4 to 5 days ago.  Positive COVID test 4 days ago.  Temp currently 102.2 degrees.  Pulse elevated at 109 bpm.  She is ill-appearing and diaphoretic.  She is in no acute distress.   On exam  she has nasal congestion.  Throat is clear.  Chest clear to auscultation heart regular rhythm.  Patient does not need repeat COVID testing so we will hold off at this time.  Symptoms and presentation are consistent with acute COVID-19 infection.  Patient has no significant medical problems to qualify her for antiviral therapy and she is already at 4 to 5 days of the illness.  Advised her at this time supportive care should be continued with increasing rest and fluids and continuing antipyretics.  Offered cough medication but she declined stating she will continue with OTC cough meds.  Reviewed current CDC guidelines, isolation protocol and ED precautions.  Work note given.   Final Clinical Impressions(s) / UC Diagnoses   Final diagnoses:  COVID-19  Fever, unspecified  Acute cough     Discharge Instructions      -Newest CDC guidelines state you should stay at home and isolate until you are 24 hours without a fever and your symptoms have improved.  This will probably be at least another 2 to 3 days for you. - Take ibuprofen and Tylenol as needed for fever and make sure to rest increase your fluid intake. - Take over-the-counter cough medication as needed for your symptoms. - Return or go to ER if you have any uncontrolled fever, weakness or shortness of breath that is worsening.     ED Prescriptions   None    PDMP not reviewed this encounter.   Danton Clap, PA-C 09/27/22 1407

## 2022-10-06 ENCOUNTER — Ambulatory Visit
Admission: EM | Admit: 2022-10-06 | Discharge: 2022-10-06 | Disposition: A | Discharge status: Home / Self Care | Payer: BC Managed Care – PPO | Attending: Family Medicine | Admitting: Family Medicine

## 2022-10-06 DIAGNOSIS — H60392 Other infective otitis externa, left ear: Secondary | ICD-10-CM | POA: Diagnosis not present

## 2022-10-06 HISTORY — DX: COVID-19: U07.1

## 2022-10-06 MED ORDER — CIPROFLOXACIN HCL 500 MG PO TABS: 500.0000 mg | ORAL_TABLET | Freq: Two times a day (BID) | ORAL | 0 refills | Status: DC

## 2022-10-06 NOTE — ED Provider Notes (Signed)
[facility]    CSN: [id number] Arrival date & time: 10/06/22  1031      History   Chief Complaint Chief Complaint  Patient presents with   ear swelling    Left ear swelling    HPI 42 year old female presents for evaluation of the above.   Piercing ~ 5 months ago.  She has seen the individual who did the piercing and had it altered.  She has now developed swelling and pain.  She states that she was advised to see a physician.  Patient is concerned about possible chemical or allergic reaction.  She states that she has been taking good care of it and doing what she is supposed to do to keep it clean and to prevent infection.  No fever.  It is red.   Home Medications    Prior to Admission medications   Medication Sig Start Date End Date Taking? Authorizing Provider  Ascorbic Acid (VITAMIN C PO) Take 1,400 mg by mouth. Take 2 a day   Yes [provider]  cephALEXin (KEFLEX) 500 MG capsule Take 500 mg by mouth 2 (two) times daily. 08/08/20  Yes [provider]  Cholecalciferol (VITAMIN D3 PO) Take 1,000 Units by mouth.   Yes [provider]  ciprofloxacin (CIPRO) 500 MG tablet Take 1 tablet (500 mg total) by mouth 2 (two) times daily. 10/06/22  Yes Idaliz Tinkle G, DO  fluticasone (FLONASE) 50 MCG/ACT nasal spray Place into the nose. 04/16/15  Yes [provider]  levonorgestrel (LILETTA, 52 MG,) 20.1 MCG/DAY IUD IUD by Intrauterine route.   Yes [provider]  Loratadine 10 MG CAPS Take by mouth.   Yes [provider]  magnesium (MAGTAB) 84 MG ( ) TBCR SR tablet Take 84 mg by mouth.   Yes [provider]  valACYclovir (VALTREX) 1000 MG tablet TAKE 1 TABLET (1,000 MG TOTAL) BY MOUTH 2 (TWO) TIMES DAILY AS NEEDED 09/27/16  Yes [provider]    Family History Family History  Problem Relation Age of Onset   Stroke Mother    Hypertension Mother    High Cholesterol Mother    Diabetes Mother         boarder line   Healthy Father    Healthy Sister    Healthy Son    Breast cancer Other     Social History Social History   Tobacco Use   Smoking status: Never   Smokeless tobacco: Never  Vaping Use   Vaping Use: Never used  Substance Use Topics   Alcohol use: Yes    Comment: Social    Drug use: Never     Allergies   Patient has no known allergies.   Review of Systems Review of Systems Per HPI  Physical Exam Triage Vital Signs ED Triage Vitals  Enc Vitals Group     BP 10/06/22 1058 106/70     Pulse Rate 10/06/22 1058 86     Resp 10/06/22 1058 19     Temp 10/06/22 1058 98.3 F (36.8 C)     Temp Source 10/06/22 1058 Oral     SpO2 10/06/22 1058 100 %     Weight 10/06/22 1057 142 lb (64.4 kg)     Height 10/06/22 1057 5\' 5"  (1.651 m)     Head Circumference --      Peak Flow --      Pain Score 10/06/22 1057 10     Pain Loc --  Pain Edu? --      Excl. in [place]? --     Updated Vital Signs BP 106/70 (BP Location: Left Arm)   Pulse 86   Temp 98.3 F (36.8 C) (Oral)   Resp 19   Ht  (1.651 m)   Wt 64.4 kg   SpO2 100%   BMI 23.63 kg/m   Visual Acuity Right Eye Distance:   Left Eye Distance:   Bilateral Distance:    Right Eye Near:   Left Eye Near:    Bilateral Near:     Physical Exam Constitutional:      General: She is not in acute distress.    Appearance: Normal appearance.  HENT:     Head: Normocephalic and atraumatic.     Ears:      Comments: Patient has a piercing at the labeled location.  Area is tender to palpation and erythematous.  Mild swelling.  Upon palpation, drainage noted from the opening. Pulmonary:     Effort: Pulmonary effort is normal. No respiratory distress.  Neurological:     Mental Status: She is alert.      UC Treatments / Results  Labs (all labs ordered are listed, but only abnormal results are displayed) Labs Reviewed - No data to display  EKG   Radiology No results found.  Procedures Procedures  (including critical care time)  Medications Ordered in UC Medications - No data to display  Initial Impression / Assessment and Plan / UC Course  I have reviewed the triage vital signs and the nursing notes.  Pertinent labs & imaging results that were available during my care of the patient were reviewed by me and considered in my medical decision making (see chart for details).    42 year old female presents with an infection associated with her piercing.  Advised to remove the piercing.  Covering for perichondritis with Cipro.  Advised to keep the area clean.  I recommended against additional piercing down the road.  However, ultimately this is up to the patient.  Final Clinical Impressions(s) / UC Diagnoses   Final diagnoses:  Infection of left external ear     Discharge Instructions      Keep the area clean.  Remove piercing. Antibiotic as prescribed.     ED Prescriptions     Medication Sig Dispense Auth. Provider   ciprofloxacin (CIPRO) 500 MG tablet Take 1 tablet (500 mg total) by mouth 2 (two) times daily. 14 tablet Tommie Sams, DO      PDMP not reviewed this encounter.   Tommie Sams, [place] 10/06/22 1158

## 2022-10-06 NOTE — Discharge Instructions (Signed)
Keep the area clean.  Remove piercing. Antibiotic as prescribed.

## 2022-10-06 NOTE — ED Triage Notes (Signed)
Pt states that she has some left ear swelling.x1 month

## 2023-01-07 ENCOUNTER — Other Ambulatory Visit: Payer: Self-pay | Admitting: Obstetrics and Gynecology

## 2023-01-07 DIAGNOSIS — Z1231 Encounter for screening mammogram for malignant neoplasm of breast: Secondary | ICD-10-CM

## 2023-01-08 ENCOUNTER — Ambulatory Visit
Admission: RE | Admit: 2023-01-08 | Discharge: 2023-01-08 | Disposition: A | Discharge status: Home / Self Care | Payer: BC Managed Care – PPO | Source: Ambulatory Visit | Attending: Obstetrics and Gynecology | Admitting: Obstetrics and Gynecology

## 2023-01-08 DIAGNOSIS — Z1231 Encounter for screening mammogram for malignant neoplasm of breast: Secondary | ICD-10-CM | POA: Diagnosis present

## 2023-10-01 ENCOUNTER — Ambulatory Visit: Payer: Self-pay | Admitting: Family Medicine

## 2023-10-25 ENCOUNTER — Encounter: Payer: Self-pay | Admitting: Family Medicine

## 2023-10-25 ENCOUNTER — Ambulatory Visit: Payer: Self-pay | Admitting: Family Medicine

## 2023-10-25 VITALS — BP 128/75 | HR 76 | Ht 65.0 in | Wt 143.3 lb

## 2023-10-25 DIAGNOSIS — G8929 Other chronic pain: Secondary | ICD-10-CM

## 2023-10-25 DIAGNOSIS — S8991XA Unspecified injury of right lower leg, initial encounter: Secondary | ICD-10-CM | POA: Diagnosis not present

## 2023-10-25 DIAGNOSIS — H6002 Abscess of left external ear: Secondary | ICD-10-CM | POA: Diagnosis not present

## 2023-10-25 DIAGNOSIS — H6090 Unspecified otitis externa, unspecified ear: Secondary | ICD-10-CM | POA: Insufficient documentation

## 2023-10-25 DIAGNOSIS — J3089 Other allergic rhinitis: Secondary | ICD-10-CM | POA: Diagnosis not present

## 2023-10-25 DIAGNOSIS — J309 Allergic rhinitis, unspecified: Secondary | ICD-10-CM | POA: Insufficient documentation

## 2023-10-25 DIAGNOSIS — M25552 Pain in left hip: Secondary | ICD-10-CM

## 2023-10-25 MED ORDER — DOXYCYCLINE HYCLATE 100 MG PO TABS
100.0000 mg | ORAL_TABLET | Freq: Two times a day (BID) | ORAL | 0 refills | Status: AC
Start: 2023-10-25 — End: 2023-11-04

## 2023-10-25 NOTE — Patient Instructions (Signed)
 Please report to [facility] located at:  [address]  [place], [place] [address]  You do not need an appointment to have xrays completed.   Our office will follow up with  results once available.

## 2023-10-25 NOTE — Progress Notes (Signed)
 "   New patient visit   Patient: Ashlee Blake   DOB: 06/22/1981   43 y.o. Female  MRN: [id number] Visit Date: 10/25/2023  Today's healthcare provider: Rockie Agent, MD   Chief Complaint  Patient presents with   New Patient (Initial Visit)    Patient is present to establish care and has concerns for brusing on her knee that is still present after falling 6 months ago. Edema in left ear after piercing X 1 year ago and has several UC visits since then, seemed worse after visit and she now believes it has become a cauliflower ear. Edema associated with itching and yellowish fluid leakage as of yesterday and redness. Also reports possible bug bite on left arm as of this morning    Care Management    HIV Screening - declined Hepatits C Screening - declined Cervical Cancer Screening - completed last year with Dr.Beasley. Records requested   Subjective    Ashlee Blake is a 43 y.o. female who presents today as a new patient to establish care.   HPI     New Patient (Initial Visit)    Additional comments: Patient is present to establish care and has concerns for brusing on her knee that is still present after falling 6 months ago. Edema in left ear after piercing X 1 year ago and has several UC visits since then, seemed worse after visit and she now believes it has become a cauliflower ear. Edema associated with itching and yellowish fluid leakage as of yesterday and redness. Also reports possible bug bite on left arm as of this morning         Care Management    Additional comments: HIV Screening - declined Hepatits C Screening - declined Cervical Cancer Screening - completed last year with Dr.Beasley. Records requested      Last edited by Lilian Fitzpatrick, CMA on 10/25/2023  3:49 PM.       Discussed the use of AI scribe software for clinical note transcription with the patient, who gave verbal consent to proceed.  History of Present Illness Ashlee Blake is a 43 year old  female who presents to establish care with complaints of left ear pain and knee pain.  She has been experiencing left ear pain for a year following a cartilage piercing that became infected. Despite removing the piercing, the area remains swollen and leaks yellow fluid. The ear is red, itchy, and warm, with crusty dried discharge. She has visited urgent care multiple times and is concerned about structural changes to her ear.  She reports right knee pain following a fall six months ago while running on a trail. She landed on hard packed dirt, resulting in a bruise that persisted for months. The pain was severe enough to prevent kneeling for a month. Although the bruising has mostly resolved, she questions if there is an underlying issue due to the prolonged nature of the symptoms.  Additionally, she mentions left hip pain that began two years ago during a Pilates class on a cruise. The pain is located in the hip and gluteal area, exacerbated by sitting on hard surfaces or sleeping on the affected side. The pain is less frequent now but still present.  In January, she experienced persistent stomach pain and was evaluated in the ER, where a lesion on her esophagus and hip was found. The esophageal lesion was a benign leiomyoma, which was surgically removed a month ago. She is currently on gabapentin 300 mg three  times a day for post-surgical pain and nerve damage, which initially presented as a rash. She has incisions on her back and stomach from the surgery.  She has a history of a precancerous mole behind her knee and spots on her arm, which were removed by dermatology. She is followed by Dr. Jolly at [facility] for dermatological care.  Her current medications include gabapentin 300 mg three times a day, occasional Claritin for allergies, and she has a Liletta IUD. She has previously used Keflex for a suspected infection post-surgery, which was later identified as nerve damage.  No fevers, chills, vomiting,  sore throat, or eye pain. Reports itchy eyes, likely due to allergies.      Past Medical History:  Diagnosis Date   COVID-19    Migraine     Outpatient Medications Prior to Visit  Medication Sig   Docusate Sodium (DSS) 100 MG CAPS Take 100 mg by mouth.   gabapentin (NEURONTIN) 300 MG capsule Take 300 mg by mouth 3 (three) times daily.   levonorgestrel (LILETTA, 52 MG,) 20.1 MCG/DAY IUD IUD by Intrauterine route.   ascorbic acid (VITAMIN C) 500 MG tablet Take 1 tablet by mouth daily. (Patient not taking: Reported on 10/25/2023)   Biotin (SUPER BIOTIN) 5 MG TABS by NG-Tube route daily before breakfast. (Patient not taking: Reported on 10/25/2023)   Cholecalciferol (VITAMIN D3 PO) Take 1,000 Units by mouth. (Patient not taking: Reported on 10/25/2023)   fluticasone (FLONASE) 50 MCG/ACT nasal spray Place into the nose. (Patient not taking: Reported on 10/25/2023)   Loratadine 10 MG CAPS Take by mouth. (Patient not taking: Reported on 10/25/2023)   magnesium (MAGTAB) 84 MG ( ) TBCR SR tablet Take 84 mg by mouth. (Patient not taking: Reported on 10/25/2023)   [DISCONTINUED] Ascorbic Acid (VITAMIN C PO) Take 1,400 mg by mouth. Take 2 a day (Patient not taking: Reported on 10/25/2023)   [DISCONTINUED] cephALEXin (KEFLEX) 500 MG capsule Take 500 mg by mouth 2 (two) times daily. (Patient not taking: Reported on 10/25/2023)   [DISCONTINUED] ciprofloxacin  (CIPRO ) 500 MG tablet Take 1 tablet (500 mg total) by mouth 2 (two) times daily. (Patient not taking: Reported on 10/25/2023)   [DISCONTINUED] Collagen-Vitamin C (COLLAGEN SKIN RENEWAL) 833-30 MG TABS Take by mouth. (Patient not taking: Reported on 10/25/2023)   [DISCONTINUED] methocarbamol (ROBAXIN) 500 MG tablet Take 500 mg by mouth. (Patient not taking: Reported on 10/25/2023)   [DISCONTINUED] ondansetron (ZOFRAN-ODT) 4 MG disintegrating tablet Take 4 mg by mouth every 6 (six) hours as needed. (Patient not taking: Reported on 10/25/2023)   [DISCONTINUED] oxyCODONE  (OXY IR/ROXICODONE) 5 MG immediate release tablet Take 5 mg by mouth every 4 (four) hours as needed. (Patient not taking: Reported on 10/25/2023)   [DISCONTINUED] scopolamine (TRANSDERM-SCOP) 1 MG/3DAYS Place 1 patch onto the skin every 3 (three) days. (Patient not taking: Reported on 10/25/2023)   [DISCONTINUED] triamcinolone cream (KENALOG) 0.1 % Apply 1 Application topically 2 (two) times daily. (Patient not taking: Reported on 10/25/2023)   [DISCONTINUED] valACYclovir  (VALTREX ) 1000 MG tablet TAKE 1 TABLET (1,000 MG TOTAL) BY MOUTH 2 (TWO) TIMES DAILY AS NEEDED (Patient not taking: Reported on 10/25/2023)   No facility-administered medications prior to visit.    Past Surgical History:  Procedure Laterality Date   BREAST EXCISIONAL BIOPSY     BREAST SURGERY  2013   Incision & drainage due to abscess   DILATION AND CURETTAGE OF UTERUS  2015   Family Status  Relation Name Status   Mother Devere Collet  Alive   Father  Alive   Sister  Alive   Son x2 Alive   Other pggm Alive   MGF Marinell Sous Alive  No partnership data on file   Family History  Problem Relation Age of Onset   Stroke Mother    Hypertension Mother    High Cholesterol Mother    Diabetes Mother        boarder line   Healthy Father    Healthy Sister    Healthy Son    Breast cancer Other    Hearing loss Maternal Grandfather    Social History   Socioeconomic History   Marital status: Married    Spouse name: Not on file   Number of children: 2   Years of education: Not on file   Highest education level: Master's degree (e.g., MA, MS, MEng, MEd, MSW, MBA)  Occupational History   Not on file  Tobacco Use   Smoking status: Never   Smokeless tobacco: Never  Vaping Use   Vaping status: Never Used  Substance and Sexual Activity   Alcohol use: Yes    Alcohol/week: 1.0 standard drink of alcohol    Types: 1 Glasses of wine per week    Comment: I drink infrequently, a glass or two of wine every 3-4 month   Drug use: Never    Sexual activity: Yes    Birth control/protection: I.U.D.  Other Topics Concern   Not on file  Social History Narrative   Pt lives with spouse in 2 story home with her 2 sons. She has a Masters Degree   She is right handed   She drinks coffee and tea 4 each a week.   exercise 1 hour every day.   Social Drivers of Corporate Investment Banker Strain: Low Risk  (10/21/2023)   Overall Financial Resource Strain (CARDIA)    Difficulty of Paying Living Expenses: Not very hard  Food Insecurity: No Food Insecurity (10/21/2023)   Hunger Vital Sign    Worried About Running Out of Food in the Last Year: Never true    Ran Out of Food in the Last Year: Never true  Transportation Needs: No Transportation Needs (10/21/2023)   PRAPARE - Administrator, Civil Service (Medical): No    Lack of Transportation (Non-Medical): No  Physical Activity: Sufficiently Active (10/21/2023)   Exercise Vital Sign    Days of Exercise per Week: 3 days    Minutes of Exercise per Session: 60 min  Stress: No Stress Concern Present (10/21/2023)   [employer] of Occupational Health - Occupational Stress Questionnaire    Feeling of Stress : Only a little  Social Connections: Socially Isolated (10/21/2023)   Social Connection and Isolation Panel [NHANES]    Frequency of Communication with Friends and Family: Once a week    Frequency of Social Gatherings with Friends and Family: Once a week    Attends Religious Services: Never    Database Administrator or Organizations: No    Attends Engineer, Structural: Not on file    Marital Status: Married     No Known Allergies  Immunization History  Administered Date(s) Administered   Fluzone Influenza virus vaccine,trivalent (IIV3), split virus 04/07/2014   Influenza-Unspecified 04/08/2017   PPD Test 01/03/2018   Tdap 05/06/2014    Health Maintenance  Topic Date Due   COVID-19 Vaccine (1) Never done   Cervical Cancer Screening (HPV/Pap Cotest)   Never done   Hepatitis C  Screening  10/24/2024 (Originally 09/02/2000)   HIV Screening  10/24/2024 (Originally 09/02/1997)   INFLUENZA VACCINE  01/24/2024   DTaP/Tdap/Td (2 - Td or Tdap) 05/06/2024   HPV VACCINES  Aged Out   Meningococcal B Vaccine  Aged Out    Patient Care Team: Sharma Coyer, MD as PCP - General (Family Medicine) Verdon Keen, MD as Consulting Physician (Obstetrics and Gynecology) Chrystie Larissa RAMAN, MD as Referring Physician (Dermatology) Long, Selinda HERO (Inactive) as Referring Physician  Review of Systems  Last CBC Lab Results  Component Value Date   WBC 4.2 03/31/2019   HGB 13.9 03/31/2019   HCT 40.4 03/31/2019   MCV 91.8 03/31/2019   MCH 31.6 03/31/2019   RDW 12.9 03/31/2019   PLT 212 03/31/2019   Last metabolic panel Lab Results  Component Value Date   GLUCOSE 118 (H) 03/31/2019   NA 140 03/31/2019   K 3.8 03/31/2019   CL 104 03/31/2019   CO2 25 03/31/2019   BUN 11 03/31/2019   CREATININE 0.66 03/31/2019   GFRNONAA >60 03/31/2019   CALCIUM 9.4 03/31/2019   PROT 6.8 07/12/2014   ALBUMIN 2.6 (L) 07/12/2014   BILITOT 0.3 07/12/2014   ALKPHOS 97 07/12/2014   AST 31 07/12/2014   ALT 24 07/12/2014   ANIONGAP 11 03/31/2019   Last lipids No results found for: CHOL, HDL, LDLCALC, LDLDIRECT, TRIG, CHOLHDL Last hemoglobin A1c No results found for: HGBA1C Last thyroid functions No results found for: TSH, T3TOTAL, T4TOTAL, THYROIDAB Last vitamin D No results found for: 25OHVITD2, 25OHVITD3, VD25OH Last vitamin B12 and Folate No results found for: VITAMINB12, FOLATE      Objective    BP 128/75 (BP Location: Right Arm, Patient Position: Sitting, Cuff Size: Normal)   Pulse 76   Ht 5' 5 (1.651 m)   Wt 143 lb 4.8 oz (65 kg)   SpO2 100%   BMI 23.85 kg/m  BP Readings from Last 3 Encounters:  10/25/23 128/75  10/06/22 106/70  09/27/22 102/68   Wt Readings from Last 3 Encounters:  10/25/23 143 lb  4.8 oz (65 kg)  10/06/22 142 lb (64.4 kg)  09/04/20 132 lb (59.9 kg)        Depression Screen    10/25/2023    3:52 PM  PHQ 2/9 Scores  PHQ - 2 Score 0  PHQ- 9 Score 2   No results found for any visits on 10/25/23.   Physical Exam Vitals reviewed.  Constitutional:      General: She is not in acute distress.    Appearance: Normal appearance. She is not ill-appearing, toxic-appearing or diaphoretic.  Eyes:     Conjunctiva/sclera: Conjunctivae normal.  Cardiovascular:     Rate and Rhythm: Normal rate and regular rhythm.     Pulses: Normal pulses.     Heart sounds: Normal heart sounds. No murmur heard.    No friction rub. No gallop.  Pulmonary:     Effort: Pulmonary effort is normal. No respiratory distress.     Breath sounds: Normal breath sounds. No stridor. No wheezing, rhonchi or rales.  Abdominal:     General: Bowel sounds are normal. There is no distension.     Palpations: Abdomen is soft.     Tenderness: There is no abdominal tenderness.  Musculoskeletal:     Right lower leg: No edema.     Left lower leg: No edema.     Comments: RIGHT Knee: - Inspection: no gross deformity. No swelling/effusion, erythema or bruising. Skin intact -  Palpation: TTP of the patella,  - ROM: full active ROM with flexion and extension in knee and hip - Strength: 5/5 strength - Neuro/vasc: NV intact - Special Tests: - LIGAMENTS: negative anterior and posterior drawer, negative Lachman's, no MCL or LCL laxity  -- MENISCUS: negative McMurray's, negative Thessaly  -- PF JOINT: nml patellar mobility bilaterally.  negative patellar grind, negative patellar apprehension    Skin:    Findings: No erythema or rash.  Neurological:     Mental Status: She is alert and oriented to person, place, and time.  Psychiatric:        Mood and Affect: Mood and affect normal.        Speech: Speech normal.        Behavior: Behavior normal. Behavior is cooperative.         Assessment & Plan       Problem List Items Addressed This Visit       Respiratory   Allergic rhinitis due to allergen     Nervous and Auditory   Otitis externa - Primary   Relevant Medications   doxycycline  (VIBRA -TABS) 100 MG tablet     Other   Right knee injury, initial encounter   Relevant Orders   DG Knee 4 Views W/Patella Right   Chronic left hip pain   Relevant Medications   gabapentin (NEURONTIN) 300 MG capsule   Other Relevant Orders   DG HIP UNILAT W OR W/O PELVIS 2-3 VIEWS LEFT     Assessment & Plan Right knee pain Chronic right knee pain following a fall in September or October. Persistent bruising and pain suggest possible underlying injury. Range of motion is intact, but prolonged bruising raises concern for potential patellar fracture or other injury. - Order right knee x-ray to assess for potential fracture or other injury. - Refer to orthopedics if x-ray reveals significant findings.  Left hip pain Chronic left hip pain following a Pilates class injury two years ago. Pain is less frequent but persists, especially when sleeping on the hip or sitting for prolonged periods. - Include left hip in x-ray order to assess for any underlying issues.  Left ear abscess Chronic left ear swelling and drainage following a piercing. Recent exacerbation with purulent drainage, warmth, and redness. Concern for potential cauliflower ear. - Prescribe doxycycline  100 mg twice daily for 10 days. - Refer to ENT for further evaluation and management. - Advise to seek immediate care if symptoms worsen, including fever, chills, or significant swelling.  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 Currently managed with gabapentin.  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 Post-operative recovery  ongoing with pain managed by gabapentin. - Continue follow-up with thoracic surgeon, Dr. Selinda Law, in three weeks.      Return in about 3 months (around 01/14/2024) for CPE.      Rockie Agent, MD  [facility] [phone] (phone) [phone] (fax)  [facility] "

## 2023-10-29 ENCOUNTER — Ambulatory Visit: Payer: Self-pay

## 2023-10-29 NOTE — Telephone Encounter (Signed)
 FYI please see the triage message below, the pt has been scheduled to be seen in clinic on 5/7

## 2023-10-29 NOTE — Telephone Encounter (Signed)
 Chief Complaint: rash Symptoms: rash Frequency: since Pertinent Negatives: Patient denies fever Disposition: [] ED /[] Urgent Care (no appt availability in office) / [x] Appointment(In office/virtual)/ []  [place] [facility]/ [] Home Care/ [] Refused Recommended Disposition /[] [facility] [facility]/ []  Follow-up with PCP Additional Notes: Pt states that she was started on Cipro  for ear abscess.  States now that rash has spread. States she only had one spot on her elbow Friday and now spread everywhere. States spots range from pin prick to dime size and oozing yellow tinted clear liquid.    Copied from CRM [id number]. Topic: Clinical - Red Word Triage >> Oct 29, 2023 10:23 AM Juluis Ok wrote: [facility] that prompted transfer to Nurse Triage: rash with pain, itching, drainage- Reason for Disposition  SEVERE itching (i.e., interferes with sleep, normal activities or school)  Answer Assessment - Initial Assessment Questions 1. APPEARANCE of RASH: "Describe the rash." (e.g., spots, blisters, raised areas, skin peeling, scaly)     blisters 2. SIZE: "How big are the spots?" (e.g., tip of pen, eraser, coin; inches, centimeters)     Pin pricks to smaller than dime 3. LOCATION: "Where is the rash located?"     Every where 4. COLOR: "What color is the rash?" (Note: It is difficult to assess rash color in people with darker-colored skin. When this situation occurs, simply ask the caller to describe what they see.)     red 5. ONSET: "When did the rash begin?"     Friday 6. FEVER: "Do you have a fever?" If Yes, ask: "What is your temperature, how was it measured, and when did it start?"     no 7. ITCHING: "Does the rash itch?" If Yes, ask: "How bad is the itch?" (Scale 1-10; or mild, moderate, severe)     mod 8. CAUSE: "What do you think is causing the rash?"     unknown 9. MEDICINE FACTORS: "Have you started any new medicines within the last 2 weeks?" (e.g., antibiotics)      cipro  10. OTHER  SYMPTOMS: "Do you have any other symptoms?" (e.g., dizziness, headache, sore throat, joint pain)       no  Protocols used: Rash or Redness - [facility]

## 2023-10-30 ENCOUNTER — Encounter: Payer: Self-pay | Admitting: Family Medicine

## 2023-10-30 ENCOUNTER — Ambulatory Visit: Admitting: Family Medicine

## 2023-10-30 VITALS — BP 115/75 | HR 77 | Ht 65.0 in | Wt 142.0 lb

## 2023-10-30 DIAGNOSIS — H6002 Abscess of left external ear: Secondary | ICD-10-CM | POA: Diagnosis not present

## 2023-10-30 DIAGNOSIS — R21 Rash and other nonspecific skin eruption: Secondary | ICD-10-CM | POA: Diagnosis not present

## 2023-10-30 MED ORDER — VALACYCLOVIR HCL 1 G PO TABS: 1000.0000 mg | ORAL_TABLET | Freq: Three times a day (TID) | ORAL | 0 refills | Status: AC

## 2023-10-30 MED ORDER — PREDNISONE 20 MG PO TABS: 20.0000 mg | ORAL_TABLET | Freq: Every day | ORAL | 0 refills | Status: AC

## 2023-10-30 NOTE — Progress Notes (Signed)
 ACUTE PATIENT VISIT    Patient: Ashlee Blake   DOB: [birth year]   43 y.o. Female  MRN: [id number] Visit Date: 10/30/2023  Today's healthcare provider: Mimi Alt, MD   PCP: Mimi Alt, MD   Chief Complaint  Patient presents with   Rash    Possible bug bites, there was only one last week and not its spread to other places on her body     Subjective     HPI     Rash    Additional comments: Possible bug bites, there was only one last week and not its spread to other places on her body       Last edited by Bart Lieu, CMA on 10/30/2023  1:04 PM.       Discussed the use of AI scribe software for clinical note transcription with the patient, who gave verbal consent to proceed.  History of Present Illness          Discussed the use of AI scribe software for clinical note transcription with the patient, who gave verbal consent to proceed.  History of Present Illness Ashlee Blake is a 43 year old female who presents with a new rash and ear symptoms.  She developed a rash last week, initially thought to be a bug bite, which has spread to her trunk, elbows, chin, and belly button, with the latter being the most painful area. The rash consists of small bumps that ooze a clear fluid with a yellow tint. Despite using calamine lotion, hydrocortisone, steroid cream, and antibacterial wound wash, there has been no improvement. The rash is extremely itchy and painful, especially in areas that are rubbed raw, such as the belly button. She is concerned about the contagious nature of the rash and has been keeping it covered to prevent spreading.  She is currently taking ciprofloxacin  for an ear issue that began with swelling of the pinna and fluid leakage, leading to an emergency room visit. Despite treatment, there has been no improvement in the rash, which she initially thought might be related to the ear infection. The ear continues to leak fluid, and  she experiences pain inside the ear.  Her past medical history includes shingles and cold sores. She denies any recent exposure to plants that could cause contact dermatitis and has not noticed any rash on her back. She has not had a fever but reports a sore throat and occasional chills. She has been taking gabapentin since a surgery in early April and has tried Benadryl  without relief.       Past Medical History:  Diagnosis Date   COVID-19    Migraine     Medications: Outpatient Medications Prior to Visit  Medication Sig   gabapentin (NEURONTIN) 300 MG capsule Take 300 mg by mouth 3 (three) times daily.   levonorgestrel (LILETTA, 52 MG,) 20.1 MCG/DAY IUD IUD by Intrauterine route.   ascorbic acid (VITAMIN C) 500 MG tablet Take 1 tablet by mouth daily. (Patient not taking: Reported on 10/30/2023)   Biotin (SUPER BIOTIN) 5 MG TABS by NG-Tube route daily before breakfast. (Patient not taking: Reported on 10/30/2023)   Cholecalciferol (VITAMIN D3 PO) Take 1,000 Units by mouth. (Patient not taking: Reported on 10/30/2023)   doxycycline (VIBRA-TABS) 100 MG tablet Take 1 tablet (100 mg total) by mouth 2 (two) times daily for 10 days. (Patient not taking: Reported on 10/30/2023)   fluticasone (FLONASE) 50 MCG/ACT nasal spray Place into the nose. (Patient not  taking: Reported on 10/30/2023)   Loratadine 10 MG CAPS Take by mouth. (Patient not taking: Reported on 10/30/2023)   magnesium (MAGTAB) 84 MG ( ) TBCR SR tablet Take 84 mg by mouth. (Patient not taking: Reported on 10/30/2023)   No facility-administered medications prior to visit.    Review of Systems  Last CBC Lab Results  Component Value Date   WBC 4.2 03/31/2019   HGB 13.9 03/31/2019   HCT 40.4 03/31/2019   MCV 91.8 03/31/2019   MCH 31.6 03/31/2019   RDW 12.9 03/31/2019   PLT 212 03/31/2019   Last metabolic panel Lab Results  Component Value Date   GLUCOSE 118 (H) 03/31/2019   NA 140 03/31/2019   K 3.8 03/31/2019   CL 104  03/31/2019   CO2 25 03/31/2019   BUN 11 03/31/2019   CREATININE 0.66 03/31/2019   GFRNONAA >60 03/31/2019   CALCIUM 9.4 03/31/2019   PROT 6.8 07/12/2014   ALBUMIN 2.6 (L) 07/12/2014   BILITOT 0.3 07/12/2014   ALKPHOS 97 07/12/2014   AST 31 07/12/2014   ALT 24 07/12/2014   ANIONGAP 11 03/31/2019   Last lipids No results found for: "CHOL", "HDL", "LDLCALC", "LDLDIRECT", "TRIG", "CHOLHDL" Last hemoglobin A1c No results found for: "HGBA1C" Last thyroid functions No results found for: "TSH", "T3TOTAL", "T4TOTAL", "THYROIDAB" Last vitamin D No results found for: "25OHVITD2", "25OHVITD3", "VD25OH" Last vitamin B12 and Folate No results found for: "VITAMINB12", "FOLATE"      Objective    BP 115/75   Pulse 77   Ht 5\' 5"  (1.651 m)   Wt 142 lb (64.4 kg)   SpO2 100%   BMI 23.63 kg/m  BP Readings from Last 3 Encounters:  10/30/23 115/75  10/25/23 128/75  10/06/22 106/70   Wt Readings from Last 3 Encounters:  10/30/23 142 lb (64.4 kg)  10/25/23 143 lb 4.8 oz (65 kg)  10/06/22 142 lb (64.4 kg)        Physical Exam       No results found for any visits on 10/30/23.  Assessment & Plan     Problem List Items Addressed This Visit   None Visit Diagnoses       Rash    -  Primary   Relevant Medications   valACYclovir (VALTREX) 1000 MG tablet   predniSONE (DELTASONE) 20 MG tablet     Abscess of left earlobe           Assessment & Plan Rash with possible herpetic features New widespread vesicular rash with oozing fluid on the trunk, elbows, chin, and umbilicus, characterized by severe pruritus and pain, especially around the umbilicus. Differential includes herpetic rash, contact dermatitis, and viral exanthems. Lack of response to topical treatments and ciprofloxacin  raises suspicion for herpetic etiology, considering her shingles history. Initiating antiviral therapy with Valtrex, with prednisone as a contingency if no improvement is observed. - Prescribe Valtrex  1000 mg three times daily for one week - Prescribe prednisone 20 mg daily for five days if no improvement with antiviral therapy - Advise covering oozing lesions to prevent contagion - Monitor response to antiviral therapy before considering prednisone  Ear infection Persistent otitis with otorrhea and crusting, unresponsive to ciprofloxacin . ENT evaluation confirmed ear infection. Ear canal tissue appears healthy without erythema or bulging; tragus crusted but less swollen. -Continue ciprofloxacin  as prescribed -recommended ENT follow-up if symptoms persist       No follow-ups on file.         Mimi Alt, MD  [facility] [phone] (phone) [phone] (fax)  [facility]

## 2024-01-06 ENCOUNTER — Other Ambulatory Visit: Payer: Self-pay | Admitting: Obstetrics and Gynecology

## 2024-01-06 DIAGNOSIS — Z1231 Encounter for screening mammogram for malignant neoplasm of breast: Secondary | ICD-10-CM

## 2024-01-15 ENCOUNTER — Ambulatory Visit (INDEPENDENT_AMBULATORY_CARE_PROVIDER_SITE_OTHER): Admitting: Family Medicine

## 2024-01-15 ENCOUNTER — Encounter: Payer: Self-pay | Admitting: Family Medicine

## 2024-01-15 VITALS — BP 105/74 | HR 64 | Temp 98.3°F | Ht 65.0 in | Wt 142.6 lb

## 2024-01-15 DIAGNOSIS — Z130 Encounter for screening for diseases of the blood and blood-forming organs and certain disorders involving the immune mechanism: Secondary | ICD-10-CM | POA: Diagnosis not present

## 2024-01-15 DIAGNOSIS — Z131 Encounter for screening for diabetes mellitus: Secondary | ICD-10-CM

## 2024-01-15 DIAGNOSIS — Z0000 Encounter for general adult medical examination without abnormal findings: Secondary | ICD-10-CM | POA: Diagnosis not present

## 2024-01-15 DIAGNOSIS — Z13220 Encounter for screening for lipoid disorders: Secondary | ICD-10-CM | POA: Diagnosis not present

## 2024-01-15 NOTE — Progress Notes (Signed)
 Complete physical exam   Patient: Ashlee Blake   DOB: Mar 23, 1981   43 y.o. 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 female who presents today for a complete physical exam.    She does not have additional problems to discuss today.   Discussed the use of AI scribe software for clinical note transcription with the patient, who gave verbal consent to proceed.  History of Present Illness Ashlee Blake is a 43 year old female who presents for an annual physical exam.  She engages in regular physical activity by running fifteen to eighteen miles per week and walking with a weighted vest three to five times a week. She reports good sleep quality.  Her diet is dairy-free, and she incorporates vegetables into her meals despite disliking them, using methods like adding cauliflower to pizza.  She declined the HPV vaccination but is interested in the hepatitis B vaccination, although she is uncertain about her risk level due to infrequent contact with blood at work. She is unsure if she has received the hepatitis B vaccine in the past, possibly during childhood or pregnancy.  Her Pap smear was completed at [facility] in 2022, with negative results for intraepithelial lesions or malignancy, and a negative HPV component. She has scheduled a mammogram, which she needs to reschedule due to a work commitment.  Her father's mother died of cirrhosis of the liver, though she is unsure if it was genetic or lifestyle-related.     Past  Medical History:  Diagnosis Date   Anxiety 2025   Elevated heart rate/fainting   COVID-19    Migraine    Past Surgical History:  Procedure Laterality Date   BREAST EXCISIONAL BIOPSY     BREAST SURGERY  2013   Incision & drainage due to abscess   DILATION AND CURETTAGE OF UTERUS  2015   Social History   Socioeconomic History   Marital status: Married    Spouse name: Not on file   Number of children: 2   Years of education: Not on file   Highest education level: Master's degree (e.g., MA, MS, MEng, MEd, MSW, MBA)  Occupational History   Not on file  Tobacco Use   Smoking status: Never   Smokeless tobacco: Never  Vaping Use   Vaping status: Never Used  Substance and Sexual Activity   Alcohol use: Yes    Alcohol/week: 1.0 standard drink of alcohol    Types: 1 Glasses of wine per week    Comment: I drink infrequently, a glass or two of wine every 3-4 month   Drug use: Never   Sexual activity: Yes    Birth control/protection: I.U.D.  Other Topics Concern   Not on file  Social History Narrative   Pt lives with spouse in 2 story home with her 2 sons. She has a Masters Degree   She is right handed   She drinks coffee and tea 4 each a week.   exercise 1  hour every day.   Social Drivers of Corporate investment banker Strain: Low Risk  (01/11/2024)   Overall Financial Resource Strain (CARDIA)    Difficulty of Paying Living Expenses: Not hard at all  Food Insecurity: No Food Insecurity (01/11/2024)   Hunger Vital Sign    Worried About Running Out of Food in the Last Year: Never true    Ran Out of Food in the Last Year: Never true  Transportation Needs: No Transportation Needs (01/11/2024)   PRAPARE - Administrator, Civil Service (Medical): No    Lack of Transportation (Non-Medical): No  Physical Activity: Sufficiently Active (01/11/2024)   Exercise Vital Sign    Days of Exercise per Week: 4 days    Minutes of Exercise per Session: 40 min  Stress: No Stress  Concern Present (01/11/2024)   [employer] of Occupational Health - Occupational Stress Questionnaire    Feeling of Stress: Not at all  Social Connections: Socially Isolated (01/11/2024)   Social Connection and Isolation Panel    Frequency of Communication with Friends and Family: Never    Frequency of Social Gatherings with Friends and Family: Once a week    Attends Religious Services: Never    Database administrator or Organizations: No    Attends Engineer, structural: Not on file    Marital Status: Married  Catering manager Violence: Not At Risk (01/15/2024)   Humiliation, Afraid, Rape, and Kick questionnaire    Fear of Current or Ex-Partner: No    Emotionally Abused: No    Physically Abused: No    Sexually Abused: No   Family Status  Relation Name Status   Mother Devere Collet Alive   Father  Alive   Sister  Alive   Son x2 Alive   Other pggm Alive   MGF Photographer Alive  No partnership data on file   Family History  Problem Relation Age of Onset   Stroke Mother    Hypertension Mother    High Cholesterol Mother    Diabetes Mother        boarder line   Healthy Father    Healthy Sister    Healthy Son    Breast cancer Other    Hearing loss Maternal Grandfather    No Known Allergies   Medications: Outpatient Medications Prior to Visit  Medication Sig   gabapentin (NEURONTIN) 300 MG capsule Take 300 mg by mouth 3 (three) times daily.   levonorgestrel (LILETTA, 52 MG,) 20.1 MCG/DAY IUD IUD by Intrauterine route.   ascorbic acid (VITAMIN C) 500 MG tablet Take 1 tablet by mouth daily. (Patient not taking: Reported on 01/15/2024)   Biotin (SUPER BIOTIN) 5 MG TABS by NG-Tube route daily before breakfast. (Patient not taking: Reported on 01/15/2024)   Cholecalciferol (VITAMIN D3 PO) Take 1,000 Units by mouth. (Patient not taking: Reported on 01/15/2024)   fluticasone (FLONASE) 50 MCG/ACT nasal spray Place into the nose. (Patient not taking: Reported on 01/15/2024)    Loratadine 10 MG CAPS Take by mouth. (Patient not taking: Reported on 01/15/2024)   magnesium (MAGTAB) 84 MG ( ) TBCR SR tablet Take 84 mg by mouth. (Patient not taking: Reported on 01/15/2024)   No facility-administered medications prior to visit.    Review of Systems     Objective    BP 105/74 (BP Location: Right Arm, Patient Position: Sitting, Cuff Size: Normal)   Pulse 64   Temp 98.3 F (36.8 C) (Oral)   Ht 5'  5 (1.651 m)   Wt 142 lb 9.6 oz (64.7 kg)   SpO2 100%   BMI 23.73 kg/m      Physical Exam Vitals reviewed.  Constitutional:      General: She is not in acute distress.    Appearance: Normal appearance. She is not ill-appearing, toxic-appearing or diaphoretic.  HENT:     Head: Normocephalic and atraumatic.     Right Ear: Tympanic membrane and external ear normal. There is no impacted cerumen.     Left Ear: Tympanic membrane and external ear normal. There is no impacted cerumen.     Nose: Nose normal.     Mouth/Throat:     Pharynx: Oropharynx is clear.  Eyes:     General: No scleral icterus.    Extraocular Movements: Extraocular movements intact.     Conjunctiva/sclera: Conjunctivae normal.     Pupils: Pupils are equal, round, and reactive to light.  Cardiovascular:     Rate and Rhythm: Normal rate and regular rhythm.     Pulses: Normal pulses.     Heart sounds: Normal heart sounds. No murmur heard.    No friction rub. No gallop.  Pulmonary:     Effort: Pulmonary effort is normal. No respiratory distress.     Breath sounds: Normal breath sounds. No wheezing, rhonchi or rales.  Abdominal:     General: Bowel sounds are normal. There is no distension.     Palpations: Abdomen is soft. There is no mass.     Tenderness: There is no abdominal tenderness. There is no guarding.  Musculoskeletal:        General: No deformity.     Cervical back: Normal range of motion and neck supple.     Right lower leg: No edema.     Left lower leg: No edema.   Lymphadenopathy:     Cervical: No cervical adenopathy.  Skin:    General: Skin is warm.     Capillary Refill: Capillary refill takes less than 2 seconds.     Findings: No erythema or rash.  Neurological:     General: No focal deficit present.     Mental Status: She is alert and oriented to person, place, and time.     Cranial Nerves: Cranial nerves 2-12 are intact. No cranial nerve deficit or facial asymmetry.     Motor: Motor function is intact. No weakness.     Gait: Gait normal.  Psychiatric:        Mood and Affect: Mood normal.        Behavior: Behavior normal.       Last depression screening scores    01/15/2024    8:39 AM 10/25/2023    3:52 PM  PHQ 2/9 Scores  PHQ - 2 Score 0 0  PHQ- 9 Score 0 2    Last fall risk screening    01/15/2024    8:39 AM  Fall Risk   Falls in the past year? 1  Number falls in past yr: 0  Injury with Fall? 0  Follow up Falls evaluation completed    Last Audit-C alcohol use screening    01/11/2024    4:07 PM  Alcohol Use Disorder Test (AUDIT)  1. How often do you have a drink containing alcohol? 1  2. How many drinks containing alcohol do you have on a typical day when you are drinking? 0  3. How often do you have six or more drinks on one occasion? 0  AUDIT-C Score  1      Patient-reported   A score of 3 or more in women, and 4 or more in men indicates increased risk for alcohol abuse, EXCEPT if all of the points are from question 1   Results for orders placed or performed in visit on 01/15/24  HM PAP SMEAR  Result Value Ref Range   HM Pap smear Negative   Results Console HPV  Result Value Ref Range   CHL HPV Negative     Assessment & Plan    Routine Health Maintenance and Physical Exam  Immunization History  Administered Date(s) Administered   Fluzone Influenza virus vaccine,trivalent (IIV3), split virus 04/07/2014, 04/03/2023   Influenza-Unspecified 04/08/2017   PFIZER(Purple Top)SARS-COV-2 Vaccination 08/20/2019,  09/17/2019, 04/08/2020   PPD Test 01/03/2018   Pfizer Covid-19 Vaccine Bivalent Booster 31yrs & up 03/22/2021, 12/18/2021   Pfizer(Comirnaty)Fall Seasonal Vaccine 12 years and older 04/03/2023   Tdap 05/06/2014    Health Maintenance  Topic Date Due   Hepatitis B Vaccines (1 of 3 - 19+ 3-dose series) Never done   COVID-19 Vaccine (7 - Pfizer risk 2024-25 season) 01/31/2024 (Originally 10/02/2023)   Hepatitis C Screening  10/24/2024 (Originally 09/02/2000)   HIV Screening  10/24/2024 (Originally 09/02/1997)   HPV VACCINES (1 - Risk 3-dose SCDM series) 01/14/2025 (Originally 09/02/2009)   INFLUENZA VACCINE  01/24/2024   DTaP/Tdap/Td (2 - Td or Tdap) 05/06/2024   Cervical Cancer Screening (HPV/Pap Cotest)  08/17/2025   Meningococcal B Vaccine  Aged Out    Problem List Items Addressed This Visit   None Visit Diagnoses       Annual physical exam    -  Primary     Screening for deficiency anemia       Relevant Orders   CBC     Screening for diabetes mellitus       Relevant Orders   Hemoglobin A1c     Screening for lipid disorders       Relevant Orders   Lipid panel        Assessment & Plan General Health Maintenance Annual physical examination. She maintains a healthy lifestyle with regular exercise and a balanced diet, avoiding dairy. BMI is 23.734. Pap smear in 2022 was negative for intraepithelial lesions and HPV. Mammogram needs rescheduling due to a work commitment. Discussed hepatitis B vaccination, highlighting risks of liver complications and cancer. Explained the two-dose series provides protection against these complications. - Continue current exercise regimen - Order CBC for anemia screening - Order A1c for diabetes screening - Order lipid panel for hyperlipidemia screening - Advise hepatitis B vaccination for added protection - Check immunization records for hepatitis B vaccination status - Reschedule mammogram       No follow-ups on file.       Rockie Agent, MD  [facility] [phone] (phone) [phone] (fax)  [facility]

## 2024-01-15 NOTE — Patient Instructions (Signed)
 It was a pleasure to see you today!  Thank you for choosing [facility] for your primary care.   Today you were seen for your annual physical  Please review the attached information regarding helpful preventive health topics.   To keep you healthy, please keep in mind the following health maintenance items that you are due for:   Health Maintenance Due  Topic Date Due   COVID-19 Vaccine (1) Never done   Hepatitis B Vaccines (1 of 3 - 19+ 3-dose series) Never done   HPV VACCINES (1 - Risk 3-dose SCDM series) Never done   Cervical Cancer Screening (HPV/Pap Cotest)  Never done     Best Wishes,   Dr. Lang

## 2024-01-16 LAB — CBC
Hematocrit: 44.3 % (ref 34.0–46.6)
Hemoglobin: 14.7 g/dL (ref 11.1–15.9)
MCH: 31.9 pg (ref 26.6–33.0)
MCHC: 33.2 g/dL (ref 31.5–35.7)
MCV: 96 fL (ref 79–97)
Platelets: 191 x10E3/uL (ref 150–450)
RBC: 4.61 x10E6/uL (ref 3.77–5.28)
RDW: 12.2 % (ref 11.7–15.4)
WBC: 3.2 x10E3/uL — ABNORMAL LOW (ref 3.4–10.8)

## 2024-01-16 LAB — LIPID PANEL
Chol/HDL Ratio: 2.1 ratio (ref 0.0–4.4)
Cholesterol, Total: 161 mg/dL (ref 100–199)
HDL: 75 mg/dL (ref >39)
LDL Chol Calc (NIH): 76 mg/dL (ref 0–99)
Triglycerides: 49 mg/dL (ref 0–149)
VLDL Cholesterol Cal: 10 mg/dL (ref 5–40)

## 2024-01-16 LAB — HEMOGLOBIN A1C
Est. average glucose Bld gHb Est-mCnc: 100 mg/dL
Hgb A1c MFr Bld: 5.1 % (ref 4.8–5.6)

## 2024-01-21 ENCOUNTER — Ambulatory Visit: Payer: Self-pay | Admitting: Family Medicine

## 2024-02-10 ENCOUNTER — Ambulatory Visit

## 2024-02-20 ENCOUNTER — Ambulatory Visit
Admission: RE | Admit: 2024-02-20 | Discharge: 2024-02-20 | Disposition: A | Discharge status: Home / Self Care | Payer: Self-pay | Source: Ambulatory Visit | Attending: Obstetrics and Gynecology | Admitting: Obstetrics and Gynecology

## 2024-02-20 DIAGNOSIS — Z1231 Encounter for screening mammogram for malignant neoplasm of breast: Secondary | ICD-10-CM | POA: Insufficient documentation

## 2024-02-27 ENCOUNTER — Other Ambulatory Visit: Payer: Self-pay | Admitting: Family Medicine

## 2024-02-27 ENCOUNTER — Telehealth: Payer: Self-pay

## 2024-02-27 DIAGNOSIS — D72828 Other elevated white blood cell count: Secondary | ICD-10-CM

## 2024-02-27 NOTE — Progress Notes (Signed)
 CBC ordered for WBC follow up

## 2024-02-27 NOTE — Telephone Encounter (Signed)
 Copied from CRM [id number]. Topic: Appointments - Appointment Scheduling >> Feb 27, 2024 11:13 AM Ashlee Blake wrote: Patient/patient representative is calling to schedule an appointment. Refer to attachments for appointment information.   Patient is suppose to retest labs in 8 weeks no orders are in patient chart please follow up with patient

## 2024-03-14 LAB — CBC WITH DIFFERENTIAL/PLATELET
Basophils Absolute: 0 x10E3/uL (ref 0.0–0.2)
Basos: 1 %
EOS (ABSOLUTE): 0 x10E3/uL (ref 0.0–0.4)
Eos: 0 %
Hematocrit: 42.8 % (ref 34.0–46.6)
Hemoglobin: 14 g/dL (ref 11.1–15.9)
Immature Grans (Abs): 0 x10E3/uL (ref 0.0–0.1)
Immature Granulocytes: 0 %
Lymphocytes Absolute: 1.8 x10E3/uL (ref 0.7–3.1)
Lymphs: 22 %
MCH: 31.6 pg (ref 26.6–33.0)
MCHC: 32.7 g/dL (ref 31.5–35.7)
MCV: 97 fL (ref 79–97)
Monocytes Absolute: 0.3 x10E3/uL (ref 0.1–0.9)
Monocytes: 4 %
Neutrophils Absolute: 5.7 x10E3/uL (ref 1.4–7.0)
Neutrophils: 73 %
Platelets: 235 x10E3/uL (ref 150–450)
RBC: 4.43 x10E6/uL (ref 3.77–5.28)
RDW: 12.5 % (ref 11.7–15.4)
WBC: 7.9 x10E3/uL (ref 3.4–10.8)

## 2024-03-20 ENCOUNTER — Ambulatory Visit: Payer: Self-pay | Admitting: Family Medicine

## 2024-06-16 NOTE — Progress Notes (Deleted)
" °  ° ° °  Acute visit   Patient: Ashlee Blake   DOB: 1980-09-15   43 y.o. Female  MRN: [id number] PCP: Sharma Coyer, MD   No chief complaint on file.  Subjective    Discussed the use of AI scribe software for clinical note transcription with the patient, who gave verbal consent to proceed.  History of Present Illness     Review of systems as noted in HPI.   Objective    There were no vitals taken for this visit. Physical Exam    No results found for any visits on 06/17/24.  Assessment & Plan     Problem List Items Addressed This Visit   None   Assessment and Plan Assessment & Plan      No orders of the defined types were placed in this encounter.    No follow-ups on file.      Isaiah DELENA Pepper, MD  [facility] [phone] (phone) [phone] (fax) "

## 2024-06-17 ENCOUNTER — Ambulatory Visit
